# Patient Record
Sex: Female | Born: 1955 | Race: White | Hispanic: No | Marital: Married | Smoking: Former smoker
Health system: Southern US, Community
[De-identification: ages and names within clinical notes are randomized; demographics above are authoritative.]

## PROBLEM LIST (undated history)

## (undated) DIAGNOSIS — G2581 Restless legs syndrome: Secondary | ICD-10-CM

## (undated) HISTORY — PX: NO PAST SURGERIES: SHX2092

---

## 1998-02-24 ENCOUNTER — Other Ambulatory Visit: Admission: RE | Admit: 1998-02-24 | Discharge: 1998-02-24 | Payer: Self-pay | Admitting: Orthopedic Surgery

## 1998-03-09 ENCOUNTER — Other Ambulatory Visit: Admission: RE | Admit: 1998-03-09 | Discharge: 1998-03-09 | Payer: Self-pay | Admitting: Obstetrics and Gynecology

## 1999-01-27 ENCOUNTER — Encounter: Payer: Self-pay | Admitting: Obstetrics & Gynecology

## 1999-01-27 ENCOUNTER — Ambulatory Visit (HOSPITAL_COMMUNITY): Admission: RE | Admit: 1999-01-27 | Discharge: 1999-01-27 | Payer: Self-pay | Admitting: Orthopedic Surgery

## 1999-04-19 ENCOUNTER — Other Ambulatory Visit: Admission: RE | Admit: 1999-04-19 | Discharge: 1999-04-19 | Payer: Self-pay | Admitting: Gynecology

## 1999-08-25 ENCOUNTER — Ambulatory Visit (HOSPITAL_COMMUNITY): Admission: RE | Admit: 1999-08-25 | Discharge: 1999-08-25 | Payer: Self-pay | Admitting: Gynecology

## 1999-08-25 ENCOUNTER — Encounter: Payer: Self-pay | Admitting: Gynecology

## 1999-09-01 ENCOUNTER — Inpatient Hospital Stay: Admission: AD | Admit: 1999-09-01 | Discharge: 1999-09-01 | Payer: Self-pay | Admitting: Gynecology

## 1999-09-02 ENCOUNTER — Inpatient Hospital Stay (HOSPITAL_COMMUNITY): Admission: AD | Admit: 1999-09-02 | Discharge: 1999-09-02 | Payer: Self-pay | Admitting: Gynecology

## 2000-08-06 ENCOUNTER — Other Ambulatory Visit: Admission: RE | Admit: 2000-08-06 | Discharge: 2000-08-06 | Payer: Self-pay | Admitting: Gynecology

## 2000-08-26 ENCOUNTER — Ambulatory Visit (HOSPITAL_COMMUNITY): Admission: RE | Admit: 2000-08-26 | Discharge: 2000-08-26 | Payer: Self-pay | Admitting: Gynecology

## 2000-08-26 ENCOUNTER — Encounter: Payer: Self-pay | Admitting: Gynecology

## 2001-08-19 ENCOUNTER — Other Ambulatory Visit: Admission: RE | Admit: 2001-08-19 | Discharge: 2001-08-19 | Payer: Self-pay | Admitting: Gynecology

## 2001-08-28 ENCOUNTER — Encounter: Payer: Self-pay | Admitting: Gynecology

## 2001-08-28 ENCOUNTER — Ambulatory Visit (HOSPITAL_COMMUNITY): Admission: RE | Admit: 2001-08-28 | Discharge: 2001-08-28 | Payer: Self-pay | Admitting: Gynecology

## 2001-09-02 ENCOUNTER — Encounter: Payer: Self-pay | Admitting: Gynecology

## 2001-09-02 ENCOUNTER — Encounter: Admission: RE | Admit: 2001-09-02 | Discharge: 2001-09-02 | Payer: Self-pay | Admitting: Gynecology

## 2002-10-14 ENCOUNTER — Other Ambulatory Visit: Admission: RE | Admit: 2002-10-14 | Discharge: 2002-10-14 | Payer: Self-pay | Admitting: Gynecology

## 2002-10-21 ENCOUNTER — Encounter: Payer: Self-pay | Admitting: Gynecology

## 2002-10-21 ENCOUNTER — Encounter: Admission: RE | Admit: 2002-10-21 | Discharge: 2002-10-21 | Payer: Self-pay | Admitting: Gynecology

## 2003-11-04 ENCOUNTER — Encounter: Admission: RE | Admit: 2003-11-04 | Discharge: 2003-11-04 | Payer: Self-pay | Admitting: Gynecology

## 2003-11-16 ENCOUNTER — Other Ambulatory Visit: Admission: RE | Admit: 2003-11-16 | Discharge: 2003-11-16 | Payer: Self-pay | Admitting: Gynecology

## 2004-11-21 ENCOUNTER — Other Ambulatory Visit: Admission: RE | Admit: 2004-11-21 | Discharge: 2004-11-21 | Payer: Self-pay | Admitting: Gynecology

## 2004-12-06 ENCOUNTER — Encounter: Admission: RE | Admit: 2004-12-06 | Discharge: 2004-12-06 | Payer: Self-pay | Admitting: Gynecology

## 2005-09-12 ENCOUNTER — Encounter: Admission: RE | Admit: 2005-09-12 | Discharge: 2005-09-12 | Payer: Self-pay | Admitting: Internal Medicine

## 2005-12-24 ENCOUNTER — Encounter: Admission: RE | Admit: 2005-12-24 | Discharge: 2005-12-24 | Payer: Self-pay | Admitting: Gynecology

## 2005-12-25 ENCOUNTER — Other Ambulatory Visit: Admission: RE | Admit: 2005-12-25 | Discharge: 2005-12-25 | Payer: Self-pay | Admitting: Gynecology

## 2006-12-27 ENCOUNTER — Encounter: Admission: RE | Admit: 2006-12-27 | Discharge: 2006-12-27 | Payer: Self-pay | Admitting: Gynecology

## 2007-01-01 ENCOUNTER — Other Ambulatory Visit: Admission: RE | Admit: 2007-01-01 | Discharge: 2007-01-01 | Payer: Self-pay | Admitting: Gynecology

## 2007-12-29 ENCOUNTER — Encounter: Admission: RE | Admit: 2007-12-29 | Discharge: 2007-12-29 | Payer: Self-pay | Admitting: Gynecology

## 2008-03-30 ENCOUNTER — Ambulatory Visit: Payer: Self-pay | Admitting: Internal Medicine

## 2008-12-31 ENCOUNTER — Encounter: Admission: RE | Admit: 2008-12-31 | Discharge: 2008-12-31 | Payer: Self-pay | Admitting: Gynecology

## 2009-01-06 ENCOUNTER — Ambulatory Visit: Payer: Self-pay | Admitting: Internal Medicine

## 2009-02-21 ENCOUNTER — Ambulatory Visit (HOSPITAL_COMMUNITY): Admission: RE | Admit: 2009-02-21 | Discharge: 2009-02-21 | Payer: Self-pay | Admitting: Gastroenterology

## 2009-03-15 ENCOUNTER — Ambulatory Visit: Payer: Self-pay | Admitting: Internal Medicine

## 2009-12-15 ENCOUNTER — Ambulatory Visit: Payer: Self-pay | Admitting: Internal Medicine

## 2010-01-02 ENCOUNTER — Encounter: Admission: RE | Admit: 2010-01-02 | Discharge: 2010-01-02 | Payer: Self-pay | Admitting: Gynecology

## 2010-07-02 ENCOUNTER — Encounter: Payer: Self-pay | Admitting: General Surgery

## 2010-07-31 ENCOUNTER — Ambulatory Visit (INDEPENDENT_AMBULATORY_CARE_PROVIDER_SITE_OTHER): Payer: BC Managed Care – PPO | Admitting: Internal Medicine

## 2010-07-31 DIAGNOSIS — G4700 Insomnia, unspecified: Secondary | ICD-10-CM

## 2010-07-31 DIAGNOSIS — G2581 Restless legs syndrome: Secondary | ICD-10-CM

## 2010-07-31 DIAGNOSIS — R32 Unspecified urinary incontinence: Secondary | ICD-10-CM

## 2011-02-05 ENCOUNTER — Other Ambulatory Visit: Payer: Self-pay | Admitting: Gynecology

## 2011-02-05 DIAGNOSIS — Z1231 Encounter for screening mammogram for malignant neoplasm of breast: Secondary | ICD-10-CM

## 2011-03-19 ENCOUNTER — Ambulatory Visit: Payer: BC Managed Care – PPO

## 2011-03-19 ENCOUNTER — Ambulatory Visit
Admission: RE | Admit: 2011-03-19 | Discharge: 2011-03-19 | Disposition: A | Discharge status: Home / Self Care | Payer: BC Managed Care – PPO | Source: Ambulatory Visit | Attending: Gynecology | Admitting: Gynecology

## 2011-03-19 DIAGNOSIS — Z1231 Encounter for screening mammogram for malignant neoplasm of breast: Secondary | ICD-10-CM

## 2012-04-08 ENCOUNTER — Other Ambulatory Visit: Payer: Self-pay | Admitting: Gynecology

## 2012-04-08 DIAGNOSIS — Z1231 Encounter for screening mammogram for malignant neoplasm of breast: Secondary | ICD-10-CM

## 2012-06-02 ENCOUNTER — Ambulatory Visit: Payer: BC Managed Care – PPO

## 2012-06-02 ENCOUNTER — Ambulatory Visit (INDEPENDENT_AMBULATORY_CARE_PROVIDER_SITE_OTHER): Payer: BC Managed Care – PPO | Admitting: Internal Medicine

## 2012-06-02 ENCOUNTER — Encounter: Payer: Self-pay | Admitting: Internal Medicine

## 2012-06-02 VITALS — BP 118/76 | HR 76 | Ht 62.25 in | Wt 126.5 lb

## 2012-06-02 DIAGNOSIS — G4700 Insomnia, unspecified: Secondary | ICD-10-CM

## 2012-06-02 DIAGNOSIS — F411 Generalized anxiety disorder: Secondary | ICD-10-CM

## 2012-06-02 DIAGNOSIS — G2581 Restless legs syndrome: Secondary | ICD-10-CM | POA: Insufficient documentation

## 2012-06-02 DIAGNOSIS — F419 Anxiety disorder, unspecified: Secondary | ICD-10-CM | POA: Insufficient documentation

## 2012-06-02 DIAGNOSIS — Z23 Encounter for immunization: Secondary | ICD-10-CM

## 2012-06-02 NOTE — Patient Instructions (Addendum)
Continue Klonopin as needed for insomnia and restless leg and anxiety. Return in 6 months

## 2012-06-02 NOTE — Progress Notes (Signed)
  Subjective:    Patient ID: Natasha Hale, female    DOB: 1956/03/28, 56 y.o.   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 History of restless leg syndrome, insomnia and anxiety. History of lymphocytic colitis. History of stress and urge urinary incontinence. She has a handicapped child she adopted from [place]. She's been working in [place] for the past couple of years at [employer]. Now has job with [employer]. Basically staying in [place] during the week and coming home on weekends.    Review of Systems     Objective:   Physical Exam spent 20 minutes speaking with patient about anxiety disorder. Anxiety stems from stress with child. Does well with Klonopin on a limited basis.        Assessment & Plan:  Anxiety  Insomnia  History of restless legs syndrome  Plan: Refill Klonopin for 6 months. Return in 6 months. Needs to have physical exam in the near future. She agrees take influenza vaccine today.

## 2012-12-06 ENCOUNTER — Other Ambulatory Visit: Payer: Self-pay | Admitting: Internal Medicine

## 2012-12-07 NOTE — Telephone Encounter (Signed)
Note December 2013 says pt was to return in 6 months and at some point have PE. Please call her.

## 2012-12-08 ENCOUNTER — Other Ambulatory Visit: Payer: Self-pay

## 2012-12-08 MED ORDER — CLONAZEPAM 0.5 MG PO TABS: 0.5000 mg | ORAL_TABLET | Freq: Every evening | ORAL | Status: DC | PRN

## 2013-02-16 ENCOUNTER — Ambulatory Visit (INDEPENDENT_AMBULATORY_CARE_PROVIDER_SITE_OTHER): Payer: BC Managed Care – PPO | Admitting: Internal Medicine

## 2013-02-16 ENCOUNTER — Other Ambulatory Visit (INDEPENDENT_AMBULATORY_CARE_PROVIDER_SITE_OTHER): Payer: BC Managed Care – PPO | Admitting: Internal Medicine

## 2013-02-16 ENCOUNTER — Other Ambulatory Visit: Payer: Self-pay | Admitting: Internal Medicine

## 2013-02-16 ENCOUNTER — Encounter: Payer: Self-pay | Admitting: Internal Medicine

## 2013-02-16 VITALS — BP 126/68 | HR 92 | Ht 62.0 in | Wt 129.5 lb

## 2013-02-16 DIAGNOSIS — Z1231 Encounter for screening mammogram for malignant neoplasm of breast: Secondary | ICD-10-CM

## 2013-02-16 DIAGNOSIS — Z13228 Encounter for screening for other metabolic disorders: Secondary | ICD-10-CM

## 2013-02-16 DIAGNOSIS — F411 Generalized anxiety disorder: Secondary | ICD-10-CM

## 2013-02-16 DIAGNOSIS — Z1329 Encounter for screening for other suspected endocrine disorder: Secondary | ICD-10-CM

## 2013-02-16 DIAGNOSIS — Z0000 Encounter for general adult medical examination without abnormal findings: Secondary | ICD-10-CM

## 2013-02-16 DIAGNOSIS — Z13220 Encounter for screening for lipoid disorders: Secondary | ICD-10-CM

## 2013-02-16 DIAGNOSIS — Z23 Encounter for immunization: Secondary | ICD-10-CM

## 2013-02-16 DIAGNOSIS — Z780 Asymptomatic menopausal state: Secondary | ICD-10-CM

## 2013-02-16 DIAGNOSIS — Z130 Encounter for screening for diseases of the blood and blood-forming organs and certain disorders involving the immune mechanism: Secondary | ICD-10-CM

## 2013-02-16 LAB — CBC WITH DIFFERENTIAL/PLATELET
Basophils Absolute: 0.1 10*3/uL (ref 0.0–0.1)
Basophils Relative: 1 % (ref 0–1)
Eosinophils Absolute: 0.1 10*3/uL (ref 0.0–0.7)
HCT: 38.5 % (ref 36.0–46.0)
Lymphocytes Relative: 40 % (ref 12–46)
MCH: 31.9 pg (ref 26.0–34.0)
MCHC: 34.5 g/dL (ref 30.0–36.0)
MCV: 92.3 fL (ref 78.0–100.0)
Monocytes Absolute: 0.3 10*3/uL (ref 0.1–1.0)
Monocytes Relative: 6 % (ref 3–12)
Neutro Abs: 2.2 10*3/uL (ref 1.7–7.7)
Neutrophils Relative %: 50 % (ref 43–77)
Platelets: 235 10*3/uL (ref 150–400)
RDW: 13.8 % (ref 11.5–15.5)

## 2013-02-16 LAB — COMPREHENSIVE METABOLIC PANEL
Alkaline Phosphatase: 50 U/L (ref 39–117)
Calcium: 9.4 mg/dL (ref 8.4–10.5)
Chloride: 106 mEq/L (ref 96–112)
Creat: 0.75 mg/dL (ref 0.50–1.10)
Glucose, Bld: 86 mg/dL (ref 70–99)
Total Bilirubin: 0.8 mg/dL (ref 0.3–1.2)
Total Protein: 6.8 g/dL (ref 6.0–8.3)

## 2013-02-16 LAB — LIPID PANEL
HDL: 86 mg/dL (ref >39)
LDL Cholesterol: 101 mg/dL — ABNORMAL HIGH (ref 0–99)
VLDL: 11 mg/dL (ref 0–40)

## 2013-02-16 LAB — POCT URINALYSIS DIPSTICK
Blood, UA: NEGATIVE
Ketones, UA: NEGATIVE
Protein, UA: NEGATIVE

## 2013-02-16 MED ORDER — CLONAZEPAM 0.5 MG PO TABS: 0.5000 mg | ORAL_TABLET | Freq: Every evening | ORAL | Status: DC | PRN

## 2013-02-16 MED ORDER — TETANUS-DIPHTH-ACELL PERTUSSIS 5-2.5-18.5 LF-MCG/0.5 IM SUSP: 0.5000 mL | Freq: Once | INTRAMUSCULAR | Status: DC

## 2013-02-16 NOTE — Progress Notes (Signed)
  Subjective:    Patient ID: Natasha Hale, female    DOB: [birth year], 57 y.o.   MRN: [id number]  HPI  57 year old White female for health maintenance and evaluation of medical problems. GYN physician is Dr. Chevis Pretty. Weight has increased 3 pounds since last visit. [facility] I will order bone density study. Had colonoscopy by Dr. Loreta Ave in 2009.  No known drug allergies  History of anxiety, insomnia, restless leg syndrome.  History of lymphocytic colitis. History of stress and urge urinary incontinence. Had mucocele of lower lip in 2003. Morton's neuroma removed from right foot by Dr. Lajoyce Corners in 1999.  Social history: She is an Pensions consultant, she is married. Has a handicapped child she adopted from [place]. Nonsmoker. Social alcohol consumption. Husband is an attorney , Twana First who practices with [employer] and [employer].  Family history: Father died at age 59 with an MI. Mother died at age 59 with cerebral hemorrhage. One brother died of suicide. 2 brothers and one sister in good health.    Review of Systems  Constitutional: Negative.   HENT: Positive for postnasal drip.   Eyes: Negative.        No recent eye exam- reminded  Respiratory: Negative.   Cardiovascular: Negative.   Gastrointestinal: Negative.   Endocrine: Negative.   Genitourinary:       Stress and urge urinary incontinence. No meds has had urology work up  Allergic/Immunologic: Positive for environmental allergies.  Psychiatric/Behavioral:       Anxiety, insomnia, restless leg       Objective:   Physical Exam  Vitals reviewed. Constitutional: She is oriented to person, place, and time. She appears well-developed and well-nourished. No distress.  HENT:  Head: Normocephalic and atraumatic.  Right Ear: External ear normal.  Left Ear: External ear normal.  Mouth/Throat: Oropharynx is clear and moist. No oropharyngeal exudate.  Eyes: Conjunctivae and EOM are normal. Pupils are equal, round,  and reactive to light. Right eye exhibits no discharge. Left eye exhibits no discharge. No scleral icterus.  Neck: Neck supple. No JVD present. No thyromegaly present.  Cardiovascular: Normal rate, regular rhythm, normal heart sounds and intact distal pulses.   No murmur heard. Pulmonary/Chest: Effort normal and breath sounds normal. No respiratory distress. She has no rales. She exhibits no tenderness.  Breasts normal female  Abdominal: Soft. Bowel sounds are normal. She exhibits no distension and no mass. There is no tenderness. There is no rebound and no guarding.  Genitourinary:  Deferred to Dr. Chevis Pretty  Musculoskeletal: Normal range of motion. She exhibits no edema.  Lymphadenopathy:    She has no cervical adenopathy.  Neurological: She is alert and oriented to person, place, and time. She has normal reflexes. She displays normal reflexes. No cranial nerve deficit. Coordination normal.  Skin: Skin is warm and dry. No rash noted. She is not diaphoretic.  Psychiatric: She has a normal mood and affect. Her behavior is normal. Judgment and thought content normal.          Assessment & Plan:  Anxiety  Insomnia  Stress and urge urinary incontinence  History of lymphocytic colitis-asymptomatic  History of restless leg syndrome  Plan: Continue Klonopin sparingly and return in one year or as needed. Flu and tetanus vaccines given today. Have bone density study in annual mammogram.

## 2013-02-16 NOTE — Patient Instructions (Addendum)
Continue Klonopin. Flu vaccine and tetanus given today. Return in one year. Have mammogram and bone density.

## 2013-02-17 LAB — VITAMIN D 25 HYDROXY (VIT D DEFICIENCY, FRACTURES): Vit D, 25-Hydroxy: 58 ng/mL (ref 30–89)

## 2013-08-21 ENCOUNTER — Ambulatory Visit
Admission: RE | Admit: 2013-08-21 | Discharge: 2013-08-21 | Disposition: A | Discharge status: Home / Self Care | Payer: BC Managed Care – PPO | Source: Ambulatory Visit | Attending: Internal Medicine | Admitting: Internal Medicine

## 2013-08-21 DIAGNOSIS — Z780 Asymptomatic menopausal state: Secondary | ICD-10-CM

## 2013-08-21 DIAGNOSIS — Z1231 Encounter for screening mammogram for malignant neoplasm of breast: Secondary | ICD-10-CM

## 2014-03-01 ENCOUNTER — Encounter: Payer: Self-pay | Admitting: Internal Medicine

## 2014-03-01 ENCOUNTER — Ambulatory Visit (INDEPENDENT_AMBULATORY_CARE_PROVIDER_SITE_OTHER): Payer: BC Managed Care – PPO | Admitting: Internal Medicine

## 2014-03-01 ENCOUNTER — Other Ambulatory Visit (HOSPITAL_COMMUNITY)
Admission: RE | Admit: 2014-03-01 | Discharge: 2014-03-01 | Disposition: A | Discharge status: Home / Self Care | Payer: BC Managed Care – PPO | Source: Ambulatory Visit | Attending: Internal Medicine | Admitting: Internal Medicine

## 2014-03-01 VITALS — BP 122/80 | HR 78 | Ht 62.0 in | Wt 131.0 lb

## 2014-03-01 DIAGNOSIS — Z130 Encounter for screening for diseases of the blood and blood-forming organs and certain disorders involving the immune mechanism: Secondary | ICD-10-CM

## 2014-03-01 DIAGNOSIS — Z0000 Encounter for general adult medical examination without abnormal findings: Secondary | ICD-10-CM

## 2014-03-01 DIAGNOSIS — M8580 Other specified disorders of bone density and structure, unspecified site: Secondary | ICD-10-CM

## 2014-03-01 DIAGNOSIS — N3946 Mixed incontinence: Secondary | ICD-10-CM

## 2014-03-01 DIAGNOSIS — M899 Disorder of bone, unspecified: Secondary | ICD-10-CM

## 2014-03-01 DIAGNOSIS — Z1329 Encounter for screening for other suspected endocrine disorder: Secondary | ICD-10-CM

## 2014-03-01 DIAGNOSIS — M949 Disorder of cartilage, unspecified: Secondary | ICD-10-CM

## 2014-03-01 DIAGNOSIS — Z23 Encounter for immunization: Secondary | ICD-10-CM

## 2014-03-01 DIAGNOSIS — Z01419 Encounter for gynecological examination (general) (routine) without abnormal findings: Secondary | ICD-10-CM | POA: Insufficient documentation

## 2014-03-01 DIAGNOSIS — G2581 Restless legs syndrome: Secondary | ICD-10-CM

## 2014-03-01 DIAGNOSIS — Z13228 Encounter for screening for other metabolic disorders: Secondary | ICD-10-CM

## 2014-03-01 DIAGNOSIS — F411 Generalized anxiety disorder: Secondary | ICD-10-CM

## 2014-03-01 DIAGNOSIS — Z13220 Encounter for screening for lipoid disorders: Secondary | ICD-10-CM

## 2014-03-01 LAB — COMPREHENSIVE METABOLIC PANEL
ALBUMIN: 4.7 g/dL (ref 3.5–5.2)
ALK PHOS: 59 U/L (ref 39–117)
ALT: 18 U/L (ref 0–35)
AST: 22 U/L (ref 0–37)
BILIRUBIN TOTAL: 1.2 mg/dL (ref 0.2–1.2)
BUN: 13 mg/dL (ref 6–23)
CALCIUM: 9.7 mg/dL (ref 8.4–10.5)
CHLORIDE: 102 meq/L (ref 96–112)
CO2: 27 meq/L (ref 19–32)
Creat: 0.8 mg/dL (ref 0.50–1.10)
Glucose, Bld: 94 mg/dL (ref 70–99)
Potassium: 4.2 mEq/L (ref 3.5–5.3)
SODIUM: 138 meq/L (ref 135–145)
Total Protein: 6.8 g/dL (ref 6.0–8.3)

## 2014-03-01 LAB — CBC WITH DIFFERENTIAL/PLATELET
Basophils Absolute: 0 10*3/uL (ref 0.0–0.1)
Basophils Relative: 1 % (ref 0–1)
Eosinophils Absolute: 0.1 10*3/uL (ref 0.0–0.7)
Eosinophils Relative: 2 % (ref 0–5)
HEMATOCRIT: 39.4 % (ref 36.0–46.0)
HEMOGLOBIN: 13.7 g/dL (ref 12.0–15.0)
LYMPHS ABS: 1.7 10*3/uL (ref 0.7–4.0)
Lymphocytes Relative: 37 % (ref 12–46)
MCH: 31.9 pg (ref 26.0–34.0)
MCHC: 34.8 g/dL (ref 30.0–36.0)
MCV: 91.8 fL (ref 78.0–100.0)
MONOS PCT: 8 % (ref 3–12)
Monocytes Absolute: 0.4 10*3/uL (ref 0.1–1.0)
Neutro Abs: 2.4 10*3/uL (ref 1.7–7.7)
Neutrophils Relative %: 52 % (ref 43–77)
PLATELETS: 231 10*3/uL (ref 150–400)
RBC: 4.29 MIL/uL (ref 3.87–5.11)
RDW: 13.5 % (ref 11.5–15.5)
WBC: 4.6 10*3/uL (ref 4.0–10.5)

## 2014-03-01 LAB — POCT URINALYSIS DIPSTICK
BILIRUBIN UA: NEGATIVE
Glucose, UA: NEGATIVE
KETONES UA: NEGATIVE
Leukocytes, UA: NEGATIVE
NITRITE UA: NEGATIVE
PH UA: 6
PROTEIN UA: NEGATIVE
SPEC GRAV UA: 1.015
Urobilinogen, UA: NEGATIVE

## 2014-03-01 LAB — LIPID PANEL
CHOLESTEROL: 200 mg/dL (ref 0–200)
HDL: 93 mg/dL (ref >39)
LDL CALC: 85 mg/dL (ref 0–99)
Total CHOL/HDL Ratio: 2.2 Ratio
Triglycerides: 109 mg/dL (ref <150)
VLDL: 22 mg/dL (ref 0–40)

## 2014-03-01 LAB — TSH: TSH: 2.628 u[IU]/mL (ref 0.350–4.500)

## 2014-03-01 MED ORDER — CLONAZEPAM 0.5 MG PO TABS: ORAL_TABLET | ORAL | Status: DC

## 2014-03-01 NOTE — Progress Notes (Signed)
   Subjective:    Patient ID: Natasha Hale, female    DOB: 02-07-1956, 58 y.o.   MRN: [id number]  HPI  58 year old White Female for health maintenance and evaluation of medical issues. Patient has a keloid in the umbilicus. This is benign. Having some mild shortness of breath. Still has situational stress with her son she adopted from [place] who has cerebral palsy. She has a history of anxiety. History of insomnia and restless leg syndrome. She has a history of lymphocytic colitis. Has a history of stress and urge urinary incontinence. Had Morton's neuroma removed from right foot by Dr. Lajoyce Corners in 1999.  No known drug allergies.  Had colonoscopy by Dr. Lynda Rainwater in 2009.  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 Nonsmoker. Social alcohol consumption. Husband is Twana First.  Family history: Father died at age 72 of an MI. Mother died at age 44 with cerebral hemorrhage. One brother died of suicide. 2 brothers and one sister in good health.       Review of Systems  Respiratory:       Mild shortness of breath  Cardiovascular: Negative for chest pain.  Genitourinary:       Stress and urge urinary incontinence  Neurological:       History of restless leg syndrome  Psychiatric/Behavioral:       Anxiety and insomnia       Objective:   Physical Exam  Vitals reviewed. Constitutional: She is oriented to person, place, and time. She appears well-developed and well-nourished. No distress.  HENT:  Head: Normocephalic and atraumatic.  Right Ear: External ear normal.  Left Ear: External ear normal.  Mouth/Throat: Oropharynx is clear and moist. No oropharyngeal exudate.  Eyes: Conjunctivae and EOM are normal. Pupils are equal, round, and reactive to light. Right eye exhibits no discharge. Left eye exhibits no discharge.  Neck: Neck supple. No JVD present. No thyromegaly present.  Cardiovascular: Normal rate, regular rhythm, normal heart sounds and intact distal pulses.   No  murmur heard. Pulmonary/Chest: Effort normal and breath sounds normal. She has no wheezes.  Abdominal: Bowel sounds are normal. She exhibits no distension and no mass. There is no rebound.  Genitourinary:  Pap taken bimanual normal  Musculoskeletal: Normal range of motion. She exhibits no edema.  Neurological: She is alert and oriented to person, place, and time. She has normal reflexes. No cranial nerve deficit. Coordination normal.  Skin: Skin is warm and dry. No rash noted. She is not diaphoretic. No erythema.  Psychiatric: She has a normal mood and affect. Her behavior is normal. Judgment and thought content normal.          Assessment & Plan:  Anxiety-treated with Klonopin  History of insomnia  History of restless leg syndrome  Complain of mild shortness of breath-chest exam is normal. May be related to anxiety.  Osteopenia-based on bone density study March 2015. Recommend calcium and vitamin D  Plan: Continue same medications and return in one year or as needed.

## 2014-03-01 NOTE — Patient Instructions (Addendum)
Take Klonopin twice daily as needed for anxiety. Return in one year.  Take calcium supplement  500 mg daily. Take 1000 units Vitamin D3 daily. Bone density study next year. Rx given for shingles vaccine

## 2014-03-02 LAB — VITAMIN D 25 HYDROXY (VIT D DEFICIENCY, FRACTURES): VIT D 25 HYDROXY: 32 ng/mL (ref 30–89)

## 2014-03-03 LAB — CYTOLOGY - PAP

## 2014-05-24 ENCOUNTER — Encounter (INDEPENDENT_AMBULATORY_CARE_PROVIDER_SITE_OTHER): Payer: BC Managed Care – PPO | Admitting: Ophthalmology

## 2014-05-24 DIAGNOSIS — H43813 Vitreous degeneration, bilateral: Secondary | ICD-10-CM

## 2014-05-24 DIAGNOSIS — H2513 Age-related nuclear cataract, bilateral: Secondary | ICD-10-CM

## 2014-07-05 ENCOUNTER — Encounter (INDEPENDENT_AMBULATORY_CARE_PROVIDER_SITE_OTHER): Payer: BC Managed Care – PPO | Admitting: Ophthalmology

## 2014-07-12 ENCOUNTER — Ambulatory Visit (INDEPENDENT_AMBULATORY_CARE_PROVIDER_SITE_OTHER): Payer: BC Managed Care – PPO | Admitting: Internal Medicine

## 2014-07-12 ENCOUNTER — Encounter: Payer: Self-pay | Admitting: Internal Medicine

## 2014-07-12 VITALS — BP 122/78 | HR 78 | Temp 97.7°F | Wt 131.0 lb

## 2014-07-12 DIAGNOSIS — R232 Flushing: Secondary | ICD-10-CM

## 2014-07-12 DIAGNOSIS — F419 Anxiety disorder, unspecified: Secondary | ICD-10-CM

## 2014-07-12 DIAGNOSIS — L309 Dermatitis, unspecified: Secondary | ICD-10-CM

## 2014-07-12 DIAGNOSIS — N951 Menopausal and female climacteric states: Secondary | ICD-10-CM

## 2014-07-12 MED ORDER — ESTRADIOL-LEVONORGESTREL 0.045-0.015 MG/DAY TD PTWK: 1.0000 | MEDICATED_PATCH | TRANSDERMAL | Status: DC

## 2014-07-12 NOTE — Progress Notes (Signed)
   Subjective:    Patient ID: Natasha Hale, female    DOB: 09/05/1955, 59 y.o.   MRN: [id number]  HPI  Patient admits to washing her hands very frequently. Says every winter she gets irritation of her hands with redness and sometimes fissures. Says this year has been particularly bad and is worse than ever. Has tried over-the-counter Morscher risers without much relief. She has a history of anxiety disorder. She also is having issues with hot flashes. GYN physician had her on a Climara pro patch but she quit that about a year ago. She is thinking about going back on that. Says she felt better when she was on the patch.    Review of Systems     Objective:   Physical Exam  She has significant erythema and roughness of both hands with multiple superficial fissures hands and fingers      Assessment & Plan:  Hand dermatitis  Hot flashes  Plan: Triamcinolone cream 0.1% 60 g in 4 ounces of uterine cream to use 3 times a day. May need to decrease number of vigorous hand washings daily. If symptoms persist, see dermatologist. Restart Climara Pro patch

## 2014-07-12 NOTE — Patient Instructions (Addendum)
Restart Climara Pro patch to skin weekly. Use triamcinolone and Eucerin cream preparation on hands 3 times daily.

## 2014-07-27 ENCOUNTER — Other Ambulatory Visit: Payer: Self-pay | Admitting: *Deleted

## 2014-07-27 ENCOUNTER — Telehealth: Payer: Self-pay | Admitting: Internal Medicine

## 2014-07-27 NOTE — Telephone Encounter (Signed)
Called in Triamcinolone cream to patient pharmacy

## 2014-07-27 NOTE — Telephone Encounter (Signed)
We prescribed Triamcinolone cream 0.1%  60 grams in 4 ounces of Eucerin Cream. Please CALL this in . Cannot e-scribe this compound.

## 2014-07-27 NOTE — Telephone Encounter (Signed)
Patient was here on 2/1 for visit for dermatitis.  You hand wrote a Rx for her that had written directions on it for her.  She has LOST the Rx.  States she looked high and low for the Rx and she cannot locate the Rx anywhere.  She has put off calling and is embarrassed to have to call and ask for another RX.  From the note, it appears that it could be for Triamcinolone cream?    Please advise and any specific directions that you may recall from that visit.    [facility] [place].

## 2015-03-04 ENCOUNTER — Encounter: Payer: BC Managed Care – PPO | Admitting: Internal Medicine

## 2015-03-18 ENCOUNTER — Other Ambulatory Visit: Payer: BC Managed Care – PPO | Admitting: Internal Medicine

## 2015-03-18 DIAGNOSIS — Z1329 Encounter for screening for other suspected endocrine disorder: Secondary | ICD-10-CM

## 2015-03-18 DIAGNOSIS — Z0000 Encounter for general adult medical examination without abnormal findings: Secondary | ICD-10-CM

## 2015-03-18 DIAGNOSIS — Z1321 Encounter for screening for nutritional disorder: Secondary | ICD-10-CM

## 2015-03-18 DIAGNOSIS — Z130 Encounter for screening for diseases of the blood and blood-forming organs and certain disorders involving the immune mechanism: Secondary | ICD-10-CM

## 2015-03-18 DIAGNOSIS — Z13220 Encounter for screening for lipoid disorders: Secondary | ICD-10-CM

## 2015-03-18 LAB — CBC WITH DIFFERENTIAL/PLATELET
BASOS PCT: 1 % (ref 0–1)
Basophils Absolute: 0.1 10*3/uL (ref 0.0–0.1)
EOS ABS: 0.2 10*3/uL (ref 0.0–0.7)
Eosinophils Relative: 3 % (ref 0–5)
HCT: 41.5 % (ref 36.0–46.0)
Hemoglobin: 14.6 g/dL (ref 12.0–15.0)
Lymphocytes Relative: 33 % (ref 12–46)
Lymphs Abs: 1.7 10*3/uL (ref 0.7–4.0)
MCH: 32.6 pg (ref 26.0–34.0)
MCHC: 35.2 g/dL (ref 30.0–36.0)
MCV: 92.6 fL (ref 78.0–100.0)
MONO ABS: 0.4 10*3/uL (ref 0.1–1.0)
MPV: 9.6 fL (ref 8.6–12.4)
Monocytes Relative: 7 % (ref 3–12)
NEUTROS ABS: 2.9 10*3/uL (ref 1.7–7.7)
Neutrophils Relative %: 56 % (ref 43–77)
PLATELETS: 227 10*3/uL (ref 150–400)
RBC: 4.48 MIL/uL (ref 3.87–5.11)
RDW: 13.2 % (ref 11.5–15.5)
WBC: 5.2 10*3/uL (ref 4.0–10.5)

## 2015-03-18 LAB — LIPID PANEL
CHOL/HDL RATIO: 2.3 ratio (ref <=5.0)
CHOLESTEROL: 193 mg/dL (ref 125–200)
HDL: 83 mg/dL (ref >=46)
LDL Cholesterol: 97 mg/dL (ref <130)
Triglycerides: 63 mg/dL (ref <150)
VLDL: 13 mg/dL (ref <30)

## 2015-03-18 LAB — COMPLETE METABOLIC PANEL WITH GFR
ALBUMIN: 4.8 g/dL (ref 3.6–5.1)
ALT: 19 U/L (ref 6–29)
AST: 19 U/L (ref 10–35)
Alkaline Phosphatase: 55 U/L (ref 33–130)
BUN: 15 mg/dL (ref 7–25)
CHLORIDE: 102 mmol/L (ref 98–110)
CO2: 29 mmol/L (ref 20–31)
Calcium: 9.3 mg/dL (ref 8.6–10.4)
Creat: 0.73 mg/dL (ref 0.50–0.99)
GFR, Est African American: >89 mL/min (ref >=60)
GFR, Est Non African American: 89 mL/min (ref >=60)
GLUCOSE: 89 mg/dL (ref 65–99)
POTASSIUM: 4.8 mmol/L (ref 3.5–5.3)
SODIUM: 140 mmol/L (ref 135–146)
Total Bilirubin: 1.2 mg/dL (ref 0.2–1.2)
Total Protein: 7 g/dL (ref 6.1–8.1)

## 2015-03-18 LAB — TSH: TSH: 1.534 u[IU]/mL (ref 0.350–4.500)

## 2015-03-19 LAB — VITAMIN D 25 HYDROXY (VIT D DEFICIENCY, FRACTURES): VIT D 25 HYDROXY: 45 ng/mL (ref 30–100)

## 2015-03-31 ENCOUNTER — Encounter: Payer: BC Managed Care – PPO | Admitting: Internal Medicine

## 2015-04-15 ENCOUNTER — Ambulatory Visit (INDEPENDENT_AMBULATORY_CARE_PROVIDER_SITE_OTHER): Payer: BC Managed Care – PPO | Admitting: Internal Medicine

## 2015-04-15 ENCOUNTER — Encounter: Payer: Self-pay | Admitting: Internal Medicine

## 2015-04-15 VITALS — BP 130/78 | HR 85 | Temp 98.4°F | Resp 18 | Ht 62.0 in | Wt 130.0 lb

## 2015-04-15 DIAGNOSIS — F411 Generalized anxiety disorder: Secondary | ICD-10-CM

## 2015-04-15 DIAGNOSIS — Z0000 Encounter for general adult medical examination without abnormal findings: Secondary | ICD-10-CM | POA: Diagnosis not present

## 2015-04-15 DIAGNOSIS — G2581 Restless legs syndrome: Secondary | ICD-10-CM | POA: Diagnosis not present

## 2015-04-15 DIAGNOSIS — G4700 Insomnia, unspecified: Secondary | ICD-10-CM

## 2015-04-15 DIAGNOSIS — Z23 Encounter for immunization: Secondary | ICD-10-CM

## 2015-04-15 LAB — POCT URINALYSIS DIPSTICK
Bilirubin, UA: NEGATIVE
Glucose, UA: NEGATIVE
KETONES UA: NEGATIVE
Leukocytes, UA: NEGATIVE
Nitrite, UA: NEGATIVE
PH UA: 6.5
PROTEIN UA: NEGATIVE
SPEC GRAV UA: 1.025
UROBILINOGEN UA: 0.2

## 2015-04-15 MED ORDER — CLONAZEPAM 0.5 MG PO TABS: ORAL_TABLET | ORAL | Status: DC

## 2015-04-15 NOTE — Patient Instructions (Addendum)
Continue Klonopin as needed. It was a pleasure to see you. Return in 6 months.

## 2015-04-15 NOTE — Progress Notes (Signed)
   Subjective:    Patient ID: Natasha Hale, female    DOB: 05/09/1956, 59 y.o.   MRN: [id number]  HPI     59 year old White Female in today for health maintenance exam and evaluation of medical issues. Long-standing history of anxiety and insomnia. History of restless leg syndrome. History of lymphocytic colitis. History of stress and urge urinary incontinence. Had Morton's neuroma removed from right foot by Dr. Lajoyce Cornersuda 1999.  No known drug allergies.  Colonoscopy by Dr. Loreta AveMann in 2009.  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 Social alcohol consumption. Husband is Twana FirstKenneth Johnson. He is also an Pensions consultantattorney. For [place] they were living in the [place] area but [place]. She has an  adopted son from [place] who has cerebral palsy. She had some issues finding suitable care and treatment for [place] in the [place] area. Is pleased where she is living now and with arrangements for her son.  Family history: Father died at age 10859 of an MI. Mother died at age 61 with cerebral hemorrhage. One brother died of suicide. 2 brothers and one sister in good health.   Review of Systems noncontributory      Objective:   Physical Exam  Constitutional: She is oriented to person, place, and time. She appears well-developed and well-nourished. No distress.  HENT:  Head: Normocephalic and atraumatic.  Right Ear: External ear normal.  Left Ear: External ear normal.  Mouth/Throat: Oropharynx is clear and moist. No oropharyngeal exudate.  Eyes: Conjunctivae are normal. Pupils are equal, round, and reactive to light. Right eye exhibits no discharge. Left eye exhibits no discharge.  Neck: Neck supple. No JVD present. No thyromegaly present.  Cardiovascular: Normal rate, regular rhythm, normal heart sounds and intact distal pulses.   No murmur heard. Pulmonary/Chest: Effort normal and breath sounds normal. She has no wheezes. She has no rales.  Breasts normal female  without masses  Abdominal: Soft. Bowel sounds are normal. She exhibits no distension and no mass. There is no tenderness. There is no rebound and no guarding.  Genitourinary:  Deferred  Musculoskeletal: She exhibits no edema.  Neurological: She is alert and oriented to person, place, and time. She has normal reflexes.  Skin: Skin is warm and dry. No rash noted. She is not diaphoretic.  Psychiatric: She has a normal mood and affect. Her behavior is normal. Judgment and thought content normal.  Vitals reviewed.         Assessment & Plan:   Anxiety  Insomnia  Restless leg syndrome  Lymphocytic colitis  Osteopenia based on bone density study March 2015  Plan: Continue Klonopin 1 by mouth twice daily as needed for anxiety #60 with 5 refills. Return in 6 months or as needed.

## 2015-05-27 ENCOUNTER — Ambulatory Visit (INDEPENDENT_AMBULATORY_CARE_PROVIDER_SITE_OTHER): Payer: BC Managed Care – PPO | Admitting: Internal Medicine

## 2015-05-27 ENCOUNTER — Encounter: Payer: Self-pay | Admitting: Internal Medicine

## 2015-05-27 VITALS — BP 108/82 | HR 84 | Temp 98.3°F | Resp 18 | Ht 62.0 in | Wt 133.0 lb

## 2015-05-27 DIAGNOSIS — H6692 Otitis media, unspecified, left ear: Secondary | ICD-10-CM

## 2015-05-27 DIAGNOSIS — J0100 Acute maxillary sinusitis, unspecified: Secondary | ICD-10-CM

## 2015-05-27 MED ORDER — AMOXICILLIN-POT CLAVULANATE 875-125 MG PO TABS: 1.0000 | ORAL_TABLET | Freq: Two times a day (BID) | ORAL | Status: DC

## 2015-05-27 NOTE — Patient Instructions (Addendum)
Augmentin 875 mg twice a day x 10 days. Call if not better in 7-10 days or sooner if worse. It was a pleasure to see you today.

## 2015-06-11 NOTE — Progress Notes (Signed)
   Subjective:    Patient ID: Natasha Hale, female    DOB: 04/29/1956, 59 y.o.   MRN: [id number]  HPI In today with URI symptoms, maxillary sinus tenderness and congestion and left ear pain. No fever or shaking chills. Has malaise and fatigue.    Review of Systems     Objective:   Physical Exam Left TM is red. Right TM full but not red. Pharynx slightly injected. Neck supple. Chest clear to auscultation. Skin warm and dry.       Assessment & Plan:  Acute left otitis media  Maxillary sinusitis  Plan: Augmentin 875 mg twice daily for 10 days.

## 2015-07-12 ENCOUNTER — Other Ambulatory Visit: Payer: Self-pay | Admitting: Internal Medicine

## 2015-07-12 DIAGNOSIS — Z1231 Encounter for screening mammogram for malignant neoplasm of breast: Secondary | ICD-10-CM

## 2015-07-28 ENCOUNTER — Ambulatory Visit
Admission: RE | Admit: 2015-07-28 | Discharge: 2015-07-28 | Disposition: A | Discharge status: Home / Self Care | Payer: BC Managed Care – PPO | Source: Ambulatory Visit | Attending: Internal Medicine | Admitting: Internal Medicine

## 2015-07-28 DIAGNOSIS — Z1231 Encounter for screening mammogram for malignant neoplasm of breast: Secondary | ICD-10-CM

## 2016-02-08 ENCOUNTER — Telehealth: Payer: Self-pay | Admitting: Internal Medicine

## 2016-02-08 NOTE — Telephone Encounter (Signed)
Refill Klonopin for 90 days only

## 2016-02-08 NOTE — Telephone Encounter (Signed)
Patient requesting a refill on Klonopin 0.5mg .  Advised patient that she has to be seen every 6 months for this type of medication.  And, she's due for her physical.  Advised that she needed to book her CPE for this year.  We booked that for 04/16/16.    [facility]

## 2016-02-09 MED ORDER — CLONAZEPAM 0.5 MG PO TABS: ORAL_TABLET | ORAL | 2 refills | Status: DC

## 2016-02-09 NOTE — Telephone Encounter (Signed)
Done

## 2016-04-12 ENCOUNTER — Other Ambulatory Visit: Payer: BC Managed Care – PPO | Admitting: Internal Medicine

## 2016-04-12 DIAGNOSIS — Z0000 Encounter for general adult medical examination without abnormal findings: Secondary | ICD-10-CM

## 2016-04-12 LAB — COMPREHENSIVE METABOLIC PANEL
ALBUMIN: 4.7 g/dL (ref 3.6–5.1)
ALT: 16 U/L (ref 6–29)
AST: 18 U/L (ref 10–35)
Alkaline Phosphatase: 55 U/L (ref 33–130)
BUN: 13 mg/dL (ref 7–25)
CHLORIDE: 102 mmol/L (ref 98–110)
CO2: 27 mmol/L (ref 20–31)
Calcium: 9.5 mg/dL (ref 8.6–10.4)
Creat: 0.73 mg/dL (ref 0.50–0.99)
Glucose, Bld: 91 mg/dL (ref 65–99)
POTASSIUM: 4.6 mmol/L (ref 3.5–5.3)
Sodium: 139 mmol/L (ref 135–146)
TOTAL PROTEIN: 7.2 g/dL (ref 6.1–8.1)
Total Bilirubin: 0.9 mg/dL (ref 0.2–1.2)

## 2016-04-12 LAB — CBC WITH DIFFERENTIAL/PLATELET
BASOS ABS: 47 {cells}/uL (ref 0–200)
Basophils Relative: 1 %
EOS ABS: 94 {cells}/uL (ref 15–500)
Eosinophils Relative: 2 %
HCT: 42.9 % (ref 35.0–45.0)
Hemoglobin: 14.3 g/dL (ref 11.7–15.5)
Lymphocytes Relative: 38 %
Lymphs Abs: 1786 cells/uL (ref 850–3900)
MCH: 31.7 pg (ref 27.0–33.0)
MCHC: 33.3 g/dL (ref 32.0–36.0)
MCV: 95.1 fL (ref 80.0–100.0)
MONOS PCT: 8 %
MPV: 9.8 fL (ref 7.5–12.5)
Monocytes Absolute: 376 cells/uL (ref 200–950)
NEUTROS ABS: 2397 {cells}/uL (ref 1500–7800)
NEUTROS PCT: 51 %
PLATELETS: 230 10*3/uL (ref 140–400)
RBC: 4.51 MIL/uL (ref 3.80–5.10)
RDW: 13.7 % (ref 11.0–15.0)
WBC: 4.7 10*3/uL (ref 3.8–10.8)

## 2016-04-12 LAB — LIPID PANEL
Cholesterol: 213 mg/dL — ABNORMAL HIGH (ref 125–200)
HDL: 89 mg/dL (ref >=46)
LDL Cholesterol: 111 mg/dL (ref <130)
TRIGLYCERIDES: 66 mg/dL (ref <150)
Total CHOL/HDL Ratio: 2.4 Ratio (ref <=5.0)
VLDL: 13 mg/dL (ref <30)

## 2016-04-12 LAB — LDL CHOLESTEROL, DIRECT: LDL DIRECT: 111 mg/dL (ref <130)

## 2016-04-12 LAB — TSH: TSH: 1.81 mIU/L

## 2016-04-13 LAB — VITAMIN D 25 HYDROXY (VIT D DEFICIENCY, FRACTURES): VIT D 25 HYDROXY: 46 ng/mL (ref 30–100)

## 2016-04-16 ENCOUNTER — Ambulatory Visit (INDEPENDENT_AMBULATORY_CARE_PROVIDER_SITE_OTHER): Payer: BC Managed Care – PPO | Admitting: Internal Medicine

## 2016-04-16 ENCOUNTER — Encounter: Payer: Self-pay | Admitting: Internal Medicine

## 2016-04-16 VITALS — BP 114/78 | HR 84 | Temp 97.3°F | Ht 61.5 in | Wt 131.0 lb

## 2016-04-16 DIAGNOSIS — G2581 Restless legs syndrome: Secondary | ICD-10-CM | POA: Diagnosis not present

## 2016-04-16 DIAGNOSIS — F411 Generalized anxiety disorder: Secondary | ICD-10-CM

## 2016-04-16 DIAGNOSIS — M545 Low back pain, unspecified: Secondary | ICD-10-CM

## 2016-04-16 DIAGNOSIS — Z23 Encounter for immunization: Secondary | ICD-10-CM

## 2016-04-16 DIAGNOSIS — F5101 Primary insomnia: Secondary | ICD-10-CM

## 2016-04-16 DIAGNOSIS — G8929 Other chronic pain: Secondary | ICD-10-CM | POA: Diagnosis not present

## 2016-04-16 DIAGNOSIS — Z0000 Encounter for general adult medical examination without abnormal findings: Secondary | ICD-10-CM

## 2016-04-16 MED ORDER — CLONAZEPAM 0.5 MG PO TABS: ORAL_TABLET | ORAL | 2 refills | Status: DC

## 2016-04-16 NOTE — Progress Notes (Signed)
Subjective:    Patient ID: Natasha Hale, female    DOB: 10/22/1955, 60 y.o.   MRN: [id number]  HPI   60 year old Female for health maintenance exam and evaluation of medical issues. Issues with left SI joint pain since January 2017. She saw Dr. Milinda CaveMcGowen at [facility] in [place].  Plain x-rays showed L4-L5 spondylosis with lateral translation causing L4 nerve root pain. Was diagnosed with scoliosis of lumbar spine and left sciatica. Mobile and aquatic therapy recommended. If no improvement MRI was recommended. Subsequently she tried meloxicam but did not take that on a regular basis. Subsequently saw a chiropractor. Back pain started after moving to a different house and unpacking boxes, moving furniture etc.  Patient has long-standing history of anxiety and insomnia. History of restless leg syndrome. History of lymphocytic colitis. History of stress and urge urinary incontinence. Had Morton's neuroma removed from right foot by Dr. Lajoyce Cornersuda in 1999.  Had colonoscopy by Dr. Loreta AveMann in 2009.  No known drug allergies.  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 Social consumption. Husband is Natasha Hale who is also an Pensions consultantattorney. For a [place] in the [place] area but [place]. She has an adopted son from [place] who has cerebral palsy. She  had some issues finding suitable care and treatment for [place] in the [place]. She is pleased where she is living now and with the arrangements for her son.  Family history: Father died at age 159 of an MI. Mother died at age 62 with a cerebral hemorrhage. One brother died of suicide. 2 brothers and one sister in good health.    Review of Systems low back pain     Objective:   Physical Exam  Constitutional: She is oriented to person, place, and time. She appears well-developed and well-nourished. No distress.  HENT:  Head: Normocephalic and atraumatic.  Right Ear: External ear  normal.  Left Ear: External ear normal.  Mouth/Throat: Oropharynx is clear and moist. No oropharyngeal exudate.  Eyes: Conjunctivae and EOM are normal. Pupils are equal, round, and reactive to light. Right eye exhibits no discharge. Left eye exhibits no discharge. No scleral icterus.  Neck: Neck supple. No JVD present. No thyromegaly present.  Cardiovascular: Normal rate, regular rhythm, normal heart sounds and intact distal pulses.   No murmur heard. Pulmonary/Chest: Effort normal and breath sounds normal. No respiratory distress. She has no wheezes. She has no rales. She exhibits no tenderness.  Breasts normal female without masses  Abdominal: She exhibits no distension and no mass. There is no tenderness. There is no rebound and no guarding.  Genitourinary:  Genitourinary Comments: Deferred  Musculoskeletal: She exhibits no edema.  Straight leg raising is negative at 90  Lymphadenopathy:    She has no cervical adenopathy.  Neurological: She is alert and oriented to person, place, and time. She has normal reflexes. No cranial nerve deficit. Coordination normal.  Skin: Skin is warm and dry. No rash noted. She is not diaphoretic.  Psychiatric: She has a normal mood and affect. Her behavior is normal. Judgment and thought content normal.  Vitals reviewed.         Assessment & Plan:  Low back pain-left SI joint. Trial of prednisone Sterapred DS 10 mg 12 day dosepak and follow-up in 3 weeks. After finishing prednisone try meloxicam 15 mg daily.  Anxiety-treated with Klonopin  Insomnia  Restless leg syndrome  History of  lymphocytic colitis  History of osteopenia based on bone density study March 2015  Continue Klonopin as needed when necessary anxiety up to twice a day.

## 2016-04-16 NOTE — Patient Instructions (Addendum)
Sterapred DS 10 mg 12 day dose pack  Then Meloxicam 15 mg daily and  RTC in 3 weeks.

## 2016-04-16 NOTE — Progress Notes (Signed)
   Subjective:    Patient ID: Natasha Hale, female    DOB: 01/31/1956, 60 y.o.   MRN: [id number]  HPI    Review of Systems     Objective:   Physical Exam        Assessment & Plan:

## 2016-05-08 ENCOUNTER — Ambulatory Visit (INDEPENDENT_AMBULATORY_CARE_PROVIDER_SITE_OTHER): Payer: BC Managed Care – PPO | Admitting: Internal Medicine

## 2016-05-08 ENCOUNTER — Encounter: Payer: Self-pay | Admitting: Internal Medicine

## 2016-05-08 VITALS — BP 116/78 | HR 79 | Temp 98.4°F | Ht 62.0 in | Wt 133.0 lb

## 2016-05-08 DIAGNOSIS — M545 Low back pain: Secondary | ICD-10-CM | POA: Diagnosis not present

## 2016-05-08 MED ORDER — MELOXICAM 15 MG PO TABS
15.0000 mg | ORAL_TABLET | Freq: Every day | ORAL | 2 refills | Status: DC
Start: 2016-05-08 — End: 2016-08-08

## 2016-05-10 NOTE — Progress Notes (Signed)
   Subjective:    Patient ID: Natasha Hale, female    DOB: 02/25/1956, 60 y.o.   MRN: [id number]  HPI Att last visit, she was having considerable pain left back into left buttock and left leg. We found her records from [facility] where she saw Dr. Milinda CaveMcGowen one in April 2017 and was treated with mobile. This did not help but not sure she took it regularly. She was diagnosed with left L4-L5 spondylosis with lateral translation causing L4 nerve root pain. X-rays of lower back showed lumbar scoliosis with spondylosis and a lateral shift L4-L5. It was recommended she have MRI if she did not improve. Patient is tired of hurting. At last visit we gave her Sterapred DS 10 mg 12 day dosepak and then advised her to restart meloxicam 15 mg daily. This is three-week follow-up appointment today.  Complaining of some intermittent paresthesias left lower extremity   Review of Systems see above     Objective:   Physical Exam Straight leg raising is negative on the right as well as the left.       Assessment & Plan:  Sciatica left lower extremity possible L4-L5 impingement  Did not respond to prednisone for 12 days  Plan: MRI of the LS spine

## 2016-05-10 NOTE — Patient Instructions (Signed)
To have MRI of the LS-spine.

## 2016-05-17 ENCOUNTER — Other Ambulatory Visit: Payer: BC Managed Care – PPO

## 2016-05-17 ENCOUNTER — Ambulatory Visit
Admission: RE | Admit: 2016-05-17 | Discharge: 2016-05-17 | Disposition: A | Discharge status: Home / Self Care | Payer: BC Managed Care – PPO | Source: Ambulatory Visit | Attending: Internal Medicine | Admitting: Internal Medicine

## 2016-05-17 DIAGNOSIS — M545 Low back pain: Secondary | ICD-10-CM

## 2016-05-18 ENCOUNTER — Telehealth: Payer: Self-pay | Admitting: Internal Medicine

## 2016-05-18 NOTE — Telephone Encounter (Signed)
Per Jenel Lucksoberta at [facility], patient would be scheduled for an injection not consult.  If this is appropriate please place order and fax to [place] at [phone].

## 2016-05-18 NOTE — Telephone Encounter (Signed)
Discussed management of back pain with patient and MRI results. Recommend physical therapy 2-3 times weekly for 3 weeks. If pain persists, consider epidural steroid injection at L1-L2

## 2016-05-18 NOTE — Telephone Encounter (Signed)
Per Dr Lenord FellersBaxley patient may benefit from back injection with Dr Benard [facility]urnes [facility].  Left message for Jenel LucksRoberta to schedule consult.

## 2016-08-08 ENCOUNTER — Other Ambulatory Visit: Payer: Self-pay | Admitting: Internal Medicine

## 2016-10-12 IMAGING — MG MM SCREEN MAMMOGRAM BILATERAL
4 series · 4 of 4 positions shown · non-contrast
Comparison: Previous exam(s).

CLINICAL DATA: Screening.

EXAM:
DIGITAL SCREENING BILATERAL MAMMOGRAM WITH CAD

[R CC]
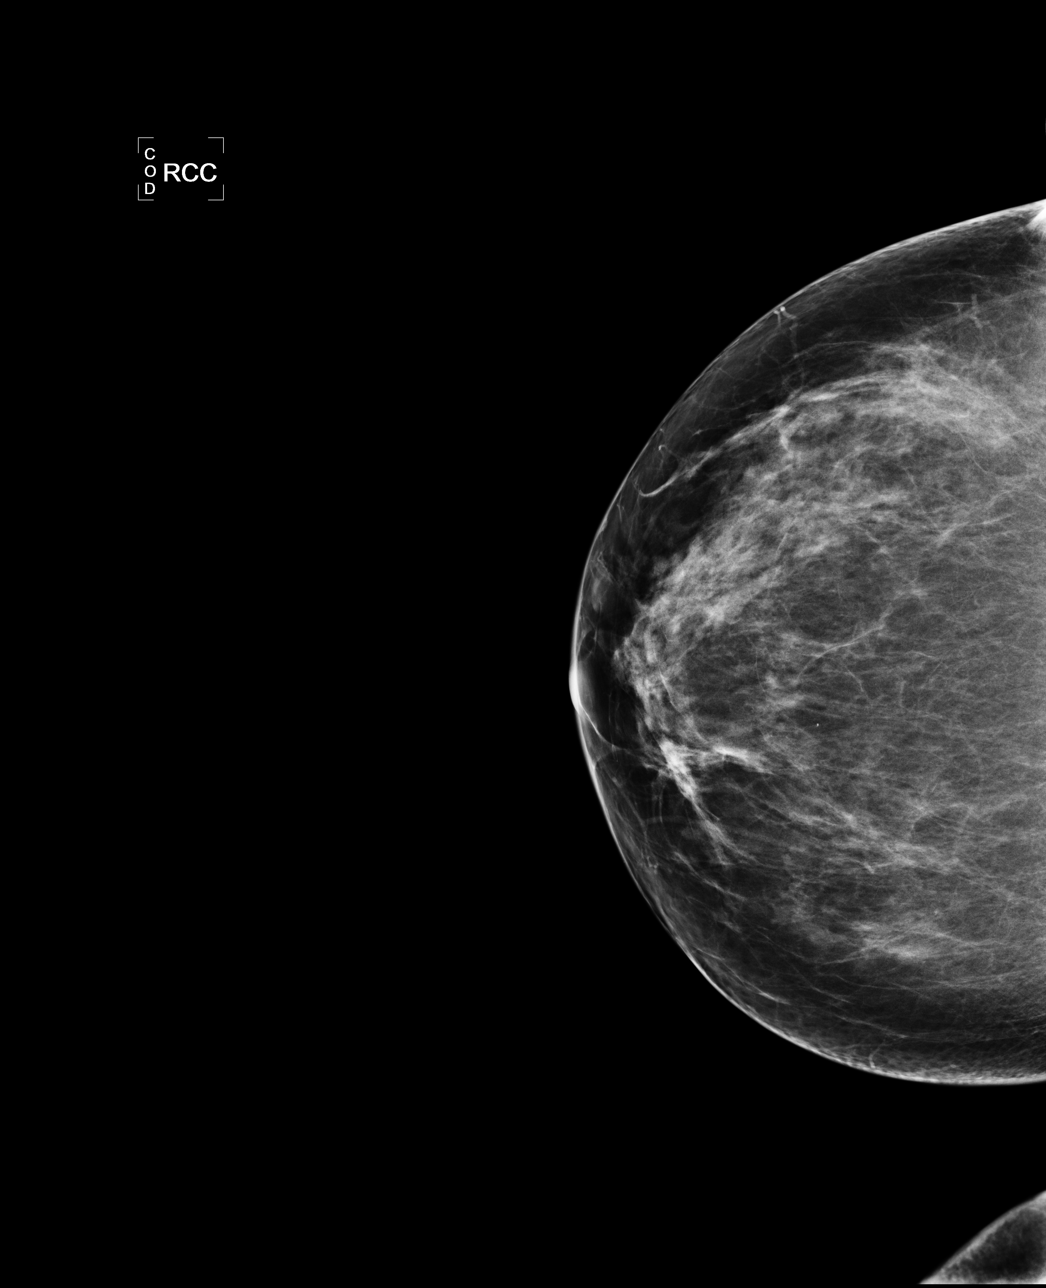

[L CC]
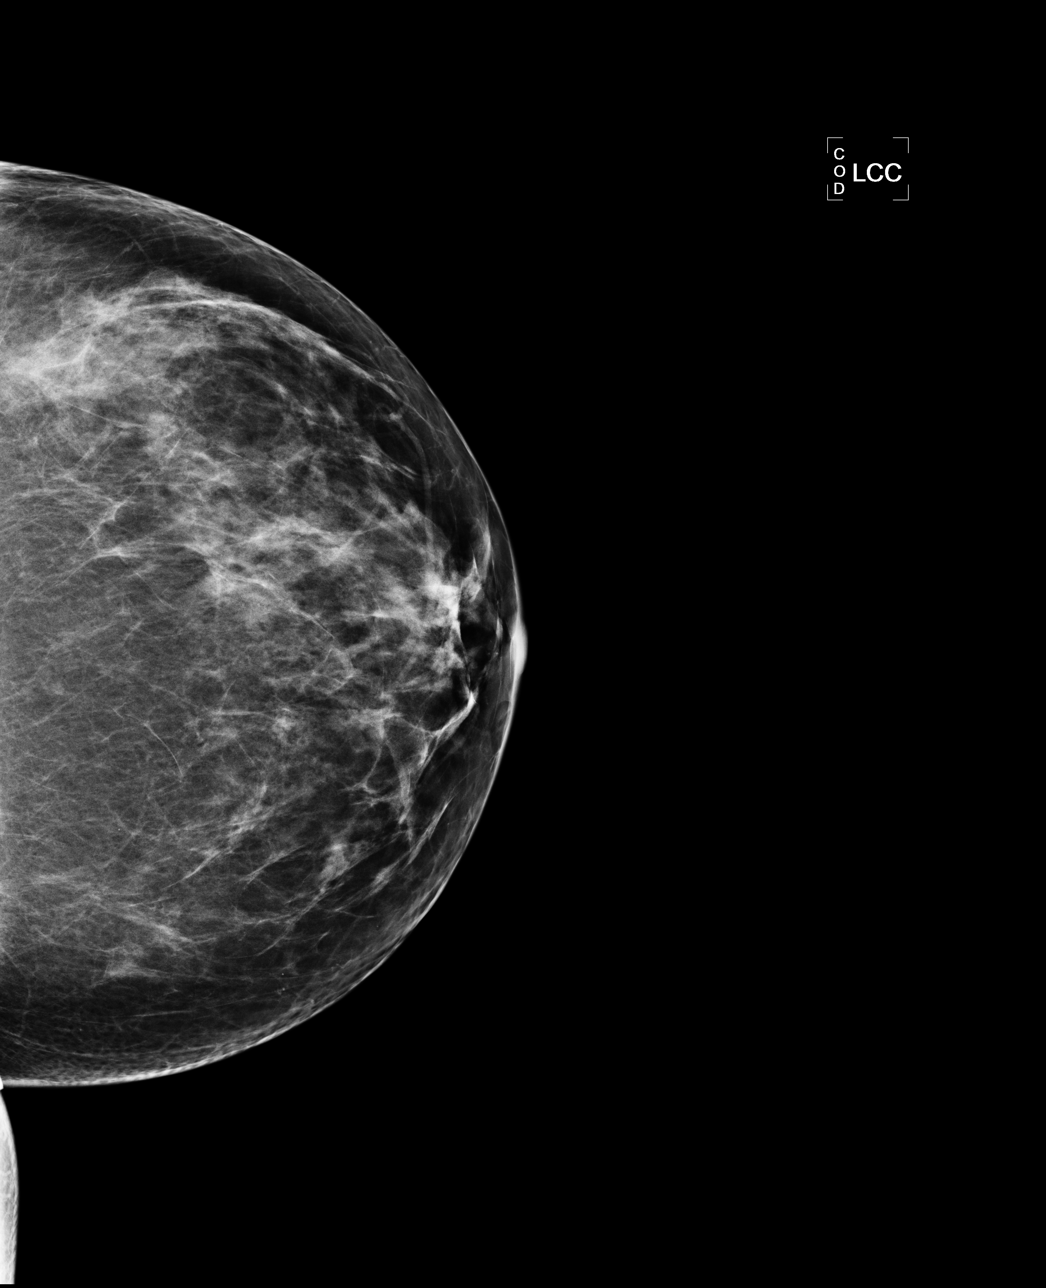

[L MLO]
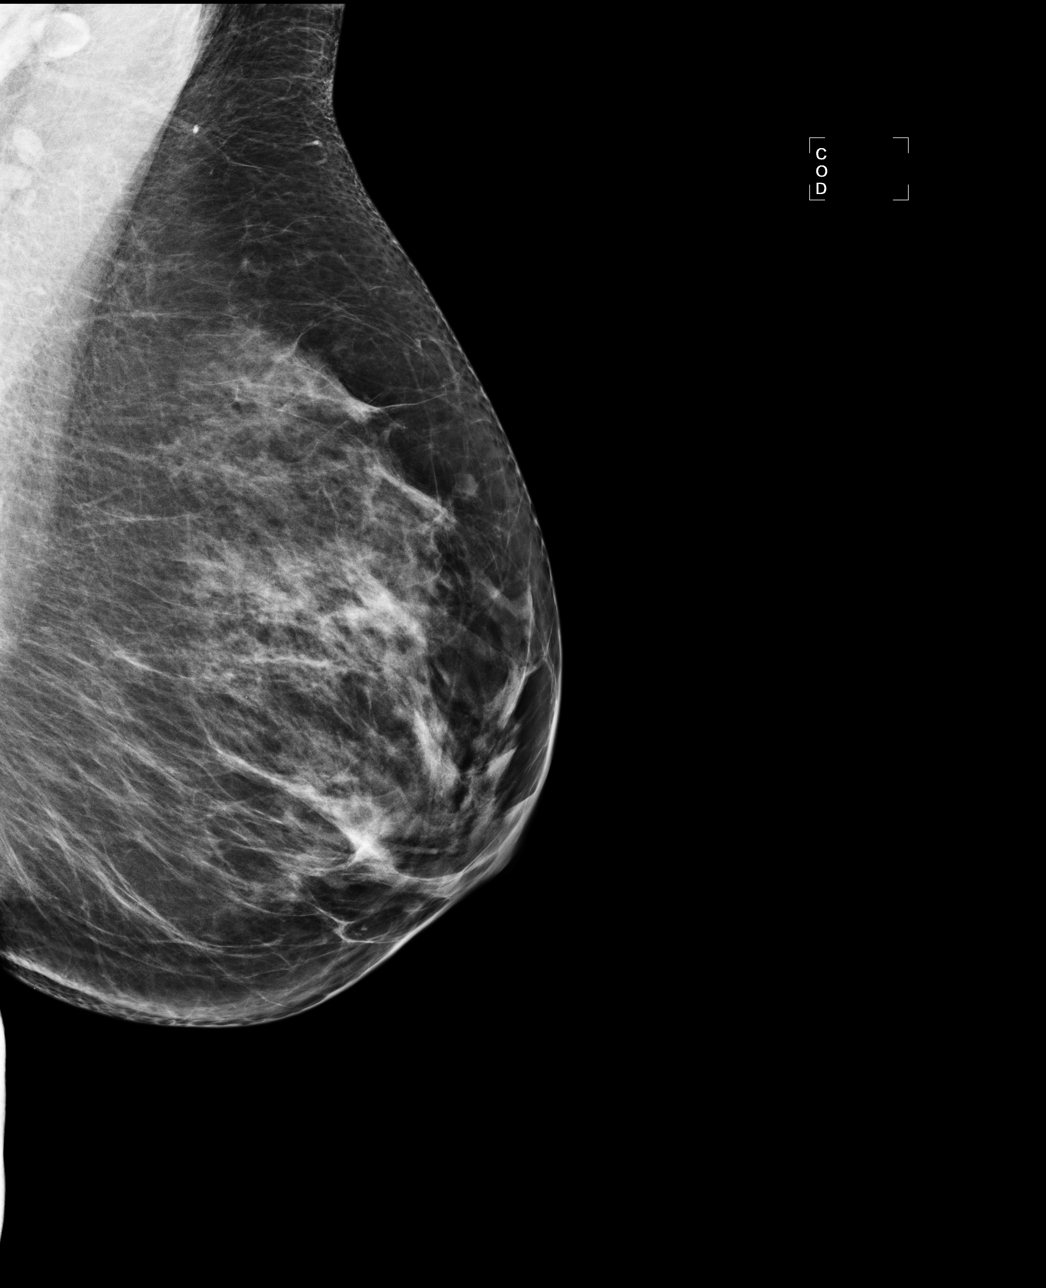

[R MLO]
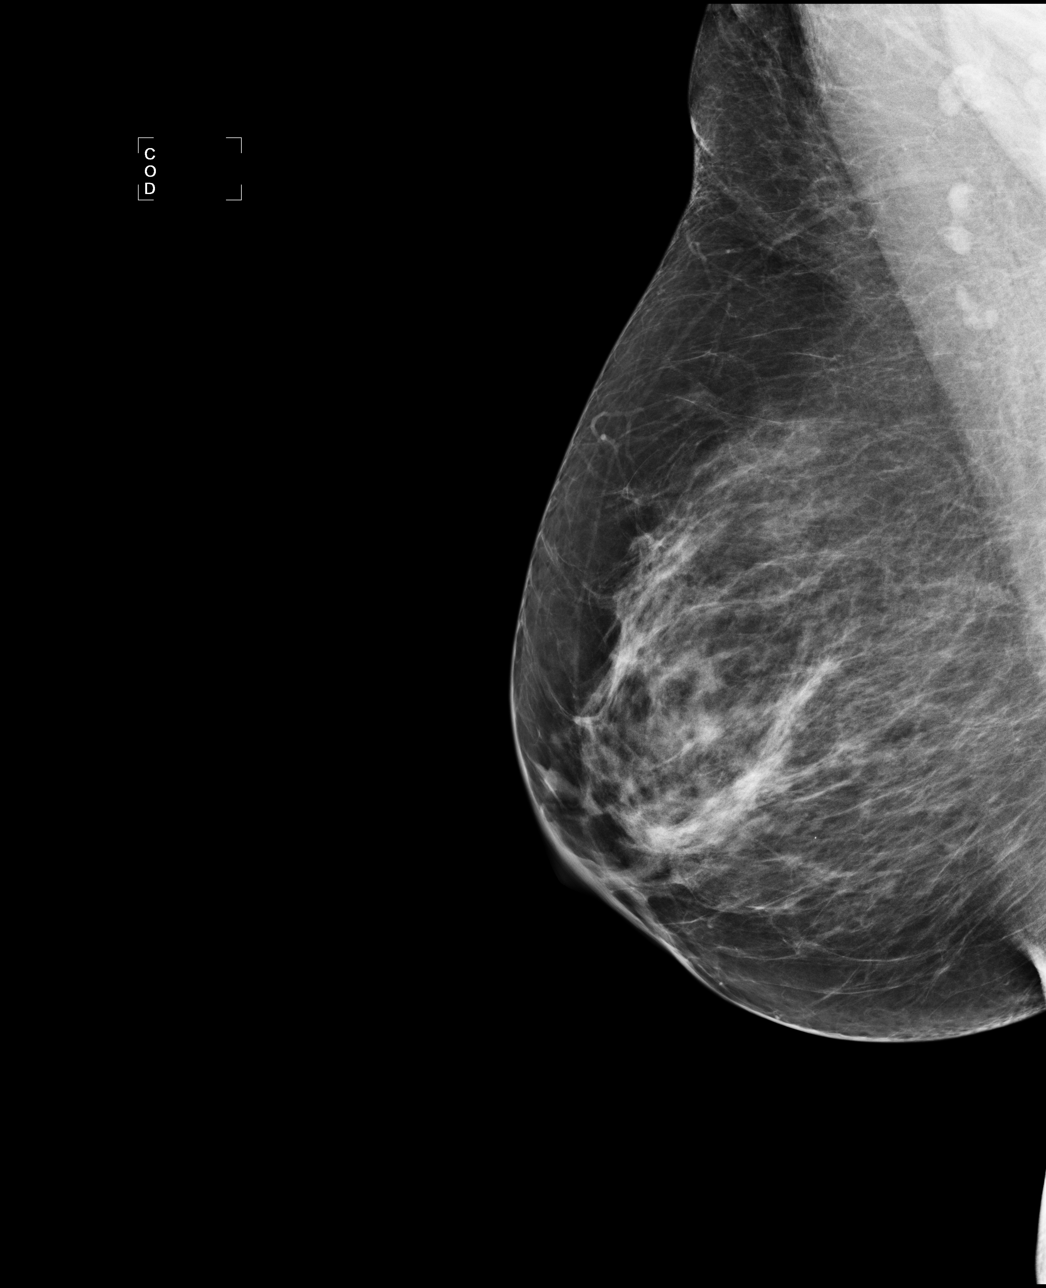

[4 of 4 positions shown; findings below may reference images not displayed]

ACR Breast Density Category b: There are scattered areas of
fibroglandular density.
FINDINGS: There are no findings suspicious for malignancy. Images were
processed with CAD.
IMPRESSION: No mammographic evidence of malignancy. A result letter of this
screening mammogram will be mailed directly to the patient.

RECOMMENDATION:
Screening mammogram in one year. (Code:AS-G-LCT)

BI-RADS CATEGORY  1: Negative.

## 2017-01-14 ENCOUNTER — Other Ambulatory Visit: Payer: Self-pay | Admitting: Internal Medicine

## 2017-01-14 DIAGNOSIS — Z1231 Encounter for screening mammogram for malignant neoplasm of breast: Secondary | ICD-10-CM

## 2017-01-17 ENCOUNTER — Ambulatory Visit: Payer: BC Managed Care – PPO

## 2017-01-21 ENCOUNTER — Ambulatory Visit
Admission: RE | Admit: 2017-01-21 | Discharge: 2017-01-21 | Disposition: A | Discharge status: Home / Self Care | Payer: BC Managed Care – PPO | Source: Ambulatory Visit | Attending: Internal Medicine | Admitting: Internal Medicine

## 2017-01-21 DIAGNOSIS — Z1231 Encounter for screening mammogram for malignant neoplasm of breast: Secondary | ICD-10-CM

## 2017-01-23 ENCOUNTER — Other Ambulatory Visit: Payer: Self-pay | Admitting: Internal Medicine

## 2017-01-23 NOTE — Telephone Encounter (Signed)
Please call her for CPE appt November  before refilling

## 2017-01-24 ENCOUNTER — Ambulatory Visit: Payer: BC Managed Care – PPO

## 2017-04-22 ENCOUNTER — Other Ambulatory Visit: Payer: BC Managed Care – PPO | Admitting: Internal Medicine

## 2017-04-25 ENCOUNTER — Encounter: Payer: BC Managed Care – PPO | Admitting: Internal Medicine

## 2017-04-26 ENCOUNTER — Other Ambulatory Visit (HOSPITAL_COMMUNITY)
Admission: RE | Admit: 2017-04-26 | Discharge: 2017-04-26 | Disposition: A | Discharge status: Home / Self Care | Payer: BC Managed Care – PPO | Source: Ambulatory Visit | Attending: Internal Medicine | Admitting: Internal Medicine

## 2017-04-26 ENCOUNTER — Encounter: Payer: Self-pay | Admitting: Internal Medicine

## 2017-04-26 ENCOUNTER — Other Ambulatory Visit: Payer: BC Managed Care – PPO | Admitting: Internal Medicine

## 2017-04-26 ENCOUNTER — Ambulatory Visit (INDEPENDENT_AMBULATORY_CARE_PROVIDER_SITE_OTHER): Payer: BC Managed Care – PPO | Admitting: Internal Medicine

## 2017-04-26 VITALS — BP 114/64 | HR 76 | Temp 98.2°F | Ht 62.0 in | Wt 134.0 lb

## 2017-04-26 DIAGNOSIS — Z13220 Encounter for screening for lipoid disorders: Secondary | ICD-10-CM

## 2017-04-26 DIAGNOSIS — F5101 Primary insomnia: Secondary | ICD-10-CM

## 2017-04-26 DIAGNOSIS — Z0000 Encounter for general adult medical examination without abnormal findings: Secondary | ICD-10-CM | POA: Insufficient documentation

## 2017-04-26 DIAGNOSIS — F419 Anxiety disorder, unspecified: Secondary | ICD-10-CM

## 2017-04-26 DIAGNOSIS — Z1329 Encounter for screening for other suspected endocrine disorder: Secondary | ICD-10-CM

## 2017-04-26 DIAGNOSIS — Z130 Encounter for screening for diseases of the blood and blood-forming organs and certain disorders involving the immune mechanism: Secondary | ICD-10-CM

## 2017-04-26 DIAGNOSIS — M545 Low back pain: Secondary | ICD-10-CM | POA: Diagnosis not present

## 2017-04-26 DIAGNOSIS — G2581 Restless legs syndrome: Secondary | ICD-10-CM

## 2017-04-26 DIAGNOSIS — Z1321 Encounter for screening for nutritional disorder: Secondary | ICD-10-CM

## 2017-04-26 DIAGNOSIS — G4700 Insomnia, unspecified: Secondary | ICD-10-CM

## 2017-04-26 LAB — POCT URINALYSIS DIPSTICK
Bilirubin, UA: NEGATIVE
Blood, UA: NEGATIVE
GLUCOSE UA: NEGATIVE
Ketones, UA: NEGATIVE
Leukocytes, UA: NEGATIVE
Nitrite, UA: NEGATIVE
Protein, UA: NEGATIVE
SPEC GRAV UA: 1.015 (ref 1.010–1.025)
Urobilinogen, UA: 0.2 E.U./dL
pH, UA: 7.5 (ref 5.0–8.0)

## 2017-04-26 MED ORDER — CLONAZEPAM 0.5 MG PO TABS: ORAL_TABLET | ORAL | 2 refills | Status: DC

## 2017-04-26 NOTE — Progress Notes (Signed)
   Subjective:    Patient ID: Natasha Hale, female    DOB: 12/20/1955, 61 y.o.   MRN: [id number]  HPI [age] year Female for health maintenance exam and evaluation of medical issues.    She has a history of left SI joint pain since January 2017.  She saw Dr. Marvel PlanMcGowan at [facility] in [place].  Was diagnosed with L4-L5 spondylosis with lateral translation causing L4 nerve root pain.  Was diagnosed with scoliosis of the lumbar spine and left sciatica.  Mobic and aqua therapy recommended.  Subsequently saw a chiropractor.  Back pain started after moving to a different house unpacking boxes and moving furniture.  She  still has issues with back pain but does not want further evaluation right now.  Long-standing history of anxiety and insomnia.  History of restless leg syndrome.  History of lymphocytic colitis.  History of stress and urge urinary incontinence.  Had Morton's neuroma removed from right foot by Dr. Lajoyce Cornersuda 1999.  Colonoscopy by Dr. 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  Non-smoker.  Husband is Twana FirstKenneth Johnson who is also an Pensions consultantattorney.  She has an adopted son from [place] who has cerebral palsy.  Family history: Father died at age 63 of an MI.  Mother died at age 63 with cerebral hemorrhage.  One brother died of suicide.  2 brothers and 1 sister in good health.    Review of Systems  Respiratory: Negative.   Cardiovascular: Negative.   Gastrointestinal: Negative.   Musculoskeletal: Positive for back pain.  Neurological: Negative.   Hematological: Negative.   Hx restless leg syndrome- check iron panel     Objective:   Physical Exam  Constitutional: She is oriented to person, place, and time. She appears well-developed and well-nourished.  HENT:  Head: Normocephalic and atraumatic.  Right Ear: External ear normal.  Left Ear: External ear normal.  Mouth/Throat: Oropharynx is clear and moist.  Eyes: Conjunctivae and EOM  are normal. Pupils are equal, round, and reactive to light. Right eye exhibits no discharge. Left eye exhibits no discharge. No scleral icterus.  Neck: Neck supple. No JVD present. No thyromegaly present.  Cardiovascular: Normal rate, regular rhythm, normal heart sounds and intact distal pulses.  No murmur heard. Pulmonary/Chest: Effort normal and breath sounds normal. She has no wheezes. She has no rales.  Abdominal: Soft. Bowel sounds are normal. She exhibits no distension and no mass. There is no tenderness. There is no rebound and no guarding.  Genitourinary:  Genitourinary Comments: Pap taken.  Bimanual normal  Musculoskeletal: She exhibits no edema.  Lymphadenopathy:    She has no cervical adenopathy.  Neurological: She is alert and oriented to person, place, and time. No cranial nerve deficit. Coordination normal.  Skin: Skin is warm and dry. No rash noted.  Psychiatric: She has a normal mood and affect. Her behavior is normal. Judgment and thought content normal.  Vitals reviewed.         Assessment & Plan:  Normal health maintenance exam  History of anxiety  Low back pain left SI joint has been treated previously with prednisone and meloxicam  Restless leg syndrome  History of lymphocytic colitis-asymptomatic  History of osteopenia based on bone density study March 2015  Plan: Anxiety is treated with Klonopin and this was refilled for 6 months

## 2017-04-26 NOTE — Patient Instructions (Signed)
It was a pleasure to see you today.  Continue Klonopin for insomnia.  Iron panel check for restless leg syndrome.  Lab work drawn and pending.

## 2017-04-27 LAB — CBC WITH DIFFERENTIAL/PLATELET
BASOS PCT: 1 %
Basophils Absolute: 49 cells/uL (ref 0–200)
EOS PCT: 2.4 %
Eosinophils Absolute: 118 cells/uL (ref 15–500)
HEMATOCRIT: 40.9 % (ref 35.0–45.0)
Hemoglobin: 14 g/dL (ref 11.7–15.5)
LYMPHS ABS: 1916 {cells}/uL (ref 850–3900)
MCH: 32.1 pg (ref 27.0–33.0)
MCHC: 34.2 g/dL (ref 32.0–36.0)
MCV: 93.8 fL (ref 80.0–100.0)
MPV: 9.6 fL (ref 7.5–12.5)
Monocytes Relative: 7.9 %
NEUTROS ABS: 2430 {cells}/uL (ref 1500–7800)
NEUTROS PCT: 49.6 %
Platelets: 232 10*3/uL (ref 140–400)
RBC: 4.36 10*6/uL (ref 3.80–5.10)
RDW: 12.3 % (ref 11.0–15.0)
Total Lymphocyte: 39.1 %
WBC: 4.9 10*3/uL (ref 3.8–10.8)
WBCMIX: 387 {cells}/uL (ref 200–950)

## 2017-04-27 LAB — VITAMIN D 25 HYDROXY (VIT D DEFICIENCY, FRACTURES): VIT D 25 HYDROXY: 40 ng/mL (ref 30–100)

## 2017-04-27 LAB — TEST AUTHORIZATION

## 2017-04-27 LAB — LIPID PANEL
CHOL/HDL RATIO: 2.1 (calc) (ref <5.0)
Cholesterol: 207 mg/dL — ABNORMAL HIGH (ref <200)
HDL: 97 mg/dL (ref >50)
LDL Cholesterol (Calc): 94 mg/dL (calc)
NON-HDL CHOLESTEROL (CALC): 110 mg/dL (ref <130)
TRIGLYCERIDES: 69 mg/dL (ref <150)

## 2017-04-27 LAB — COMPLETE METABOLIC PANEL WITH GFR
AG Ratio: 2.2 (calc) (ref 1.0–2.5)
ALT: 19 U/L (ref 6–29)
AST: 18 U/L (ref 10–35)
Albumin: 4.8 g/dL (ref 3.6–5.1)
Alkaline phosphatase (APISO): 56 U/L (ref 33–130)
BUN: 13 mg/dL (ref 7–25)
CALCIUM: 9.5 mg/dL (ref 8.6–10.4)
CO2: 29 mmol/L (ref 20–32)
CREATININE: 0.65 mg/dL (ref 0.50–0.99)
Chloride: 102 mmol/L (ref 98–110)
GFR, EST AFRICAN AMERICAN: 110 mL/min/{1.73_m2} (ref >=60)
GFR, EST NON AFRICAN AMERICAN: 94 mL/min/{1.73_m2} (ref >=60)
GLOBULIN: 2.2 g/dL (ref 1.9–3.7)
Glucose, Bld: 93 mg/dL (ref 65–99)
Potassium: 4.3 mmol/L (ref 3.5–5.3)
SODIUM: 139 mmol/L (ref 135–146)
TOTAL PROTEIN: 7 g/dL (ref 6.1–8.1)
Total Bilirubin: 0.9 mg/dL (ref 0.2–1.2)

## 2017-04-27 LAB — IRON, TOTAL/TOTAL IRON BINDING CAP
%SAT: 27 % (ref 11–50)
Iron: 90 ug/dL (ref 45–160)
TIBC: 339 ug/dL (ref 250–450)

## 2017-04-27 LAB — TSH: TSH: 2.09 mIU/L (ref 0.40–4.50)

## 2017-04-30 ENCOUNTER — Telehealth: Payer: Self-pay

## 2017-04-30 LAB — CYTOLOGY - PAP: DIAGNOSIS: NEGATIVE

## 2017-04-30 NOTE — Telephone Encounter (Signed)
Pt is aware.  

## 2017-04-30 NOTE — Telephone Encounter (Signed)
-----   Message from Margaree MackintoshMary J Baxley, MD sent at 04/30/2017  3:05 PM EST ----- Please call pt. Pap is OK

## 2018-01-13 ENCOUNTER — Other Ambulatory Visit: Payer: Self-pay | Admitting: Internal Medicine

## 2018-01-13 DIAGNOSIS — Z1231 Encounter for screening mammogram for malignant neoplasm of breast: Secondary | ICD-10-CM

## 2018-02-05 ENCOUNTER — Ambulatory Visit
Admission: RE | Admit: 2018-02-05 | Discharge: 2018-02-05 | Disposition: A | Discharge status: Home / Self Care | Payer: BC Managed Care – PPO | Source: Ambulatory Visit | Attending: Internal Medicine | Admitting: Internal Medicine

## 2018-02-05 DIAGNOSIS — Z1231 Encounter for screening mammogram for malignant neoplasm of breast: Secondary | ICD-10-CM

## 2018-03-07 ENCOUNTER — Encounter: Payer: Self-pay | Admitting: Internal Medicine

## 2018-04-15 DIAGNOSIS — M549 Dorsalgia, unspecified: Secondary | ICD-10-CM | POA: Insufficient documentation

## 2018-05-02 DIAGNOSIS — M48061 Spinal stenosis, lumbar region without neurogenic claudication: Secondary | ICD-10-CM | POA: Insufficient documentation

## 2018-05-02 DIAGNOSIS — M51369 Other intervertebral disc degeneration, lumbar region without mention of lumbar back pain or lower extremity pain: Secondary | ICD-10-CM | POA: Insufficient documentation

## 2018-05-02 DIAGNOSIS — M419 Scoliosis, unspecified: Secondary | ICD-10-CM | POA: Insufficient documentation

## 2018-07-27 ENCOUNTER — Other Ambulatory Visit: Payer: Self-pay | Admitting: Internal Medicine

## 2018-08-15 ENCOUNTER — Other Ambulatory Visit: Payer: BC Managed Care – PPO | Admitting: Internal Medicine

## 2018-08-15 DIAGNOSIS — G8929 Other chronic pain: Secondary | ICD-10-CM

## 2018-08-15 DIAGNOSIS — E785 Hyperlipidemia, unspecified: Secondary | ICD-10-CM

## 2018-08-15 DIAGNOSIS — F419 Anxiety disorder, unspecified: Secondary | ICD-10-CM

## 2018-08-15 DIAGNOSIS — G4700 Insomnia, unspecified: Secondary | ICD-10-CM

## 2018-08-15 DIAGNOSIS — M545 Low back pain, unspecified: Secondary | ICD-10-CM

## 2018-08-15 DIAGNOSIS — M5440 Lumbago with sciatica, unspecified side: Secondary | ICD-10-CM

## 2018-08-15 DIAGNOSIS — Z0000 Encounter for general adult medical examination without abnormal findings: Secondary | ICD-10-CM

## 2018-08-15 DIAGNOSIS — G2581 Restless legs syndrome: Secondary | ICD-10-CM

## 2018-08-16 LAB — COMPLETE METABOLIC PANEL WITH GFR
AG RATIO: 2 (calc) (ref 1.0–2.5)
ALBUMIN MSPROF: 4.7 g/dL (ref 3.6–5.1)
ALT: 19 U/L (ref 6–29)
AST: 19 U/L (ref 10–35)
Alkaline phosphatase (APISO): 64 U/L (ref 37–153)
BUN: 21 mg/dL (ref 7–25)
CALCIUM: 9.7 mg/dL (ref 8.6–10.4)
CO2: 28 mmol/L (ref 20–32)
Chloride: 102 mmol/L (ref 98–110)
Creat: 0.9 mg/dL (ref 0.50–0.99)
GFR, EST AFRICAN AMERICAN: 78 mL/min/{1.73_m2} (ref >=60)
GFR, EST NON AFRICAN AMERICAN: 68 mL/min/{1.73_m2} (ref >=60)
Globulin: 2.3 g/dL (calc) (ref 1.9–3.7)
Glucose, Bld: 96 mg/dL (ref 65–99)
POTASSIUM: 4.4 mmol/L (ref 3.5–5.3)
Sodium: 139 mmol/L (ref 135–146)
TOTAL PROTEIN: 7 g/dL (ref 6.1–8.1)
Total Bilirubin: 0.9 mg/dL (ref 0.2–1.2)

## 2018-08-16 LAB — CBC WITH DIFFERENTIAL/PLATELET
ABSOLUTE MONOCYTES: 350 {cells}/uL (ref 200–950)
BASOS PCT: 1.1 %
Basophils Absolute: 58 cells/uL (ref 0–200)
EOS ABS: 90 {cells}/uL (ref 15–500)
Eosinophils Relative: 1.7 %
HCT: 40 % (ref 35.0–45.0)
HEMOGLOBIN: 13.9 g/dL (ref 11.7–15.5)
LYMPHS ABS: 1526 {cells}/uL (ref 850–3900)
MCH: 32.6 pg (ref 27.0–33.0)
MCHC: 34.8 g/dL (ref 32.0–36.0)
MCV: 93.7 fL (ref 80.0–100.0)
MPV: 9.5 fL (ref 7.5–12.5)
Monocytes Relative: 6.6 %
NEUTROS ABS: 3275 {cells}/uL (ref 1500–7800)
Neutrophils Relative %: 61.8 %
PLATELETS: 263 10*3/uL (ref 140–400)
RBC: 4.27 10*6/uL (ref 3.80–5.10)
RDW: 12.4 % (ref 11.0–15.0)
TOTAL LYMPHOCYTE: 28.8 %
WBC: 5.3 10*3/uL (ref 3.8–10.8)

## 2018-08-16 LAB — LIPID PANEL
Cholesterol: 200 mg/dL — ABNORMAL HIGH (ref <200)
HDL: 75 mg/dL (ref >=50)
LDL Cholesterol (Calc): 108 mg/dL (calc) — ABNORMAL HIGH
Non-HDL Cholesterol (Calc): 125 mg/dL (calc) (ref <130)
TRIGLYCERIDES: 80 mg/dL (ref <150)
Total CHOL/HDL Ratio: 2.7 (calc) (ref <5.0)

## 2018-08-16 LAB — TSH: TSH: 2.17 m[IU]/L (ref 0.40–4.50)

## 2018-08-16 LAB — VITAMIN D 25 HYDROXY (VIT D DEFICIENCY, FRACTURES): Vit D, 25-Hydroxy: 37 ng/mL (ref 30–100)

## 2018-08-19 ENCOUNTER — Encounter: Payer: BC Managed Care – PPO | Admitting: Internal Medicine

## 2018-08-21 ENCOUNTER — Other Ambulatory Visit: Payer: Self-pay

## 2018-08-21 ENCOUNTER — Encounter: Payer: Self-pay | Admitting: Internal Medicine

## 2018-08-21 ENCOUNTER — Ambulatory Visit (INDEPENDENT_AMBULATORY_CARE_PROVIDER_SITE_OTHER): Payer: BC Managed Care – PPO | Admitting: Internal Medicine

## 2018-08-21 VITALS — BP 100/70 | HR 66 | Ht 62.0 in | Wt 132.0 lb

## 2018-08-21 DIAGNOSIS — F5101 Primary insomnia: Secondary | ICD-10-CM

## 2018-08-21 DIAGNOSIS — M419 Scoliosis, unspecified: Secondary | ICD-10-CM | POA: Diagnosis not present

## 2018-08-21 DIAGNOSIS — Z0000 Encounter for general adult medical examination without abnormal findings: Secondary | ICD-10-CM

## 2018-08-21 DIAGNOSIS — Z8669 Personal history of other diseases of the nervous system and sense organs: Secondary | ICD-10-CM

## 2018-08-21 DIAGNOSIS — M8580 Other specified disorders of bone density and structure, unspecified site: Secondary | ICD-10-CM | POA: Diagnosis not present

## 2018-08-21 LAB — POCT URINALYSIS DIPSTICK
APPEARANCE: NEGATIVE
Bilirubin, UA: NEGATIVE
Glucose, UA: NEGATIVE
Ketones, UA: NEGATIVE
LEUKOCYTES UA: NEGATIVE
Nitrite, UA: NEGATIVE
Odor: NEGATIVE
PH UA: 6.5 (ref 5.0–8.0)
Protein, UA: NEGATIVE
RBC UA: NEGATIVE
Spec Grav, UA: 1.01 (ref 1.010–1.025)
UROBILINOGEN UA: 0.2 U/dL

## 2018-08-21 NOTE — Progress Notes (Signed)
Subjective:    Patient ID: Natasha Hale, female    DOB: 1956-05-10, 63 y.o.   MRN: [id number]  HPI 63 year old Female for health maintenance exam and evaluation of medical issues.  Anxiety stable on Klonopin.  Longstanding history of anxiety and insomnia.  History of left SI joint pain onset around January 2017.  She saw Dr. Marvel Plan, orthopedist in [place] and was diagnosed with L4-L5 spondylosis with lateral translation causing L4 nerve root pain.  Was diagnosed with scoliosis of the lumbar spine and left sciatica.  Mobic was recommended.  She subsequently saw a chiropractor.  Back pain started after moving to a different house unpacking boxes and moving furniture.  See below she has seen Dr. Jule Ser recently.  History of restless leg syndrome.  History of lymphocytic colitis.  History of stress and urge urinary incontinence.  Had Morton's neuroma removed from right foot by Dr. Lajoyce Corners 1999.  Has had colonoscopy by Dr. Loreta Ave in 2009 and is likely due for follow-up.  No known drug allergies.   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  Non-smoker.  Husband is Twana First who is also an Pensions consultant.  She has [name] who has cerebral palsy.  Considering retirement later this year.  Family history: Father died at age 53 of an MI.  Mother died at age 42 with cerebral hemorrhage.  One brother died of suicide.  2 brothers and 1 sister in good health.  Review of Systems Seen by Dr. Newell Coral for back pain. Has scoliosis L1-2 and L4-5 which is being monitored and is to follow up in November.  History of restless leg syndrome-iron level has been checked previously     Objective:   Physical Exam Vitals signs reviewed.  Constitutional:      General: She is not in acute distress.    Appearance: Normal appearance.  HENT:     Head: Normocephalic and atraumatic.     Right Ear: Tympanic membrane and ear canal normal.     Left Ear: Tympanic  membrane and ear canal normal.     Nose: Nose normal.     Mouth/Throat:     Mouth: Mucous membranes are moist.     Pharynx: No oropharyngeal exudate.  Eyes:     General: No scleral icterus.       Right eye: No discharge.        Left eye: No discharge.     Extraocular Movements: Extraocular movements intact.     Conjunctiva/sclera: Conjunctivae normal.     Pupils: Pupils are equal, round, and reactive to light.  Neck:     Musculoskeletal: Neck supple. No neck rigidity.     Vascular: No carotid bruit.     Comments: No thyromegaly Cardiovascular:     Rate and Rhythm: Normal rate and regular rhythm.     Heart sounds: Normal heart sounds. No murmur.  Pulmonary:     Effort: Pulmonary effort is normal. No respiratory distress.     Breath sounds: Normal breath sounds. No wheezing or rales.  Abdominal:     General: Bowel sounds are normal.     Palpations: Abdomen is soft. There is no mass.     Tenderness: There is no guarding or rebound.  Genitourinary:    Comments: Pap deferred--- last done 2018  Musculoskeletal:     Right lower leg: No edema.     Left lower leg: No edema.  Lymphadenopathy:  Cervical: No cervical adenopathy.  Skin:    General: Skin is warm and dry.  Neurological:     General: No focal deficit present.     Mental Status: She is alert and oriented to person, place, and time.     Cranial Nerves: No cranial nerve deficit.     Sensory: No sensory deficit.     Motor: No weakness.     Coordination: Coordination normal.  Psychiatric:        Mood and Affect: Mood normal.        Behavior: Behavior normal.        Thought Content: Thought content normal.        Judgment: Judgment normal.           Assessment & Plan:  History of anxiety treated with Klonopin.  Okay to refill.  Lumbar scoliosis seen by Dr. Toy Baker and is to follow-up in November  History of restless leg syndrome- iron level is been checked previously and was normal  History of lymphocytic  colitis-asymptomatic  History of osteopenia based on bone density study March 2015.  Needs to get another bone density study in the near future.  Plan: Return in 1 year or as needed.  Continue Klonopin for anxiety.  Bone density study ordered.

## 2018-09-06 ENCOUNTER — Encounter: Payer: Self-pay | Admitting: Internal Medicine

## 2018-09-06 NOTE — Patient Instructions (Addendum)
Follow-up with Dr. Newell Coral regarding scoliosis in November.  Continue Klonopin for anxiety.  Return in 1 year or as needed.  Bone density study ordered.

## 2018-11-25 ENCOUNTER — Other Ambulatory Visit: Payer: BC Managed Care – PPO

## 2018-11-26 ENCOUNTER — Other Ambulatory Visit: Payer: BC Managed Care – PPO

## 2019-01-30 ENCOUNTER — Ambulatory Visit
Admission: RE | Admit: 2019-01-30 | Discharge: 2019-01-30 | Disposition: A | Discharge status: Home / Self Care | Payer: BC Managed Care – PPO | Source: Ambulatory Visit | Attending: Internal Medicine | Admitting: Internal Medicine

## 2019-01-30 ENCOUNTER — Encounter: Payer: Self-pay | Admitting: Internal Medicine

## 2019-01-30 ENCOUNTER — Telehealth (INDEPENDENT_AMBULATORY_CARE_PROVIDER_SITE_OTHER): Payer: Self-pay | Admitting: Internal Medicine

## 2019-01-30 ENCOUNTER — Other Ambulatory Visit: Payer: Self-pay

## 2019-01-30 DIAGNOSIS — M8580 Other specified disorders of bone density and structure, unspecified site: Secondary | ICD-10-CM

## 2019-01-30 MED ORDER — IBANDRONATE SODIUM 150 MG PO TABS: 150.0000 mg | ORAL_TABLET | ORAL | 3 refills | Status: DC

## 2019-01-30 NOTE — Telephone Encounter (Signed)
Recent bone density test shows mild osteopenia. Called pt to discuss. Will try Boniva 150 mg once a month. Will see if tolerated. Take on empty stomach monthly aand no food for at least one hour. Repeat bone density in 2 years T score -1.6 and had been -0.9 in 2015.

## 2019-02-26 ENCOUNTER — Other Ambulatory Visit: Payer: Self-pay | Admitting: Internal Medicine

## 2019-02-26 MED ORDER — CLONAZEPAM 0.5 MG PO TABS: ORAL_TABLET | ORAL | 2 refills | Status: DC

## 2019-02-26 NOTE — Telephone Encounter (Signed)
Pt wants to get a refill on clonazePAM (KLONOPIN) 0.5 MG tablet, does she need a office visit for this?

## 2019-02-26 NOTE — Telephone Encounter (Signed)
Please call her OK to refill Needs CPE [place]. Pend and I will sign

## 2019-02-26 NOTE — Addendum Note (Signed)
Addended by: Mady Haagensen on: 02/26/2019 11:35 AM   Modules accepted: Orders

## 2019-02-26 NOTE — Telephone Encounter (Signed)
CPE scheduled  

## 2019-08-27 ENCOUNTER — Other Ambulatory Visit: Payer: Self-pay

## 2019-08-27 ENCOUNTER — Other Ambulatory Visit (HOSPITAL_COMMUNITY)
Admission: RE | Admit: 2019-08-27 | Discharge: 2019-08-27 | Disposition: A | Discharge status: Home / Self Care | Payer: BC Managed Care – PPO | Source: Ambulatory Visit | Attending: Internal Medicine | Admitting: Internal Medicine

## 2019-08-27 ENCOUNTER — Encounter: Payer: Self-pay | Admitting: Internal Medicine

## 2019-08-27 ENCOUNTER — Ambulatory Visit (INDEPENDENT_AMBULATORY_CARE_PROVIDER_SITE_OTHER): Payer: BC Managed Care – PPO | Admitting: Internal Medicine

## 2019-08-27 VITALS — BP 100/60 | HR 86 | Ht 62.0 in | Wt 132.0 lb

## 2019-08-27 DIAGNOSIS — Z0000 Encounter for general adult medical examination without abnormal findings: Secondary | ICD-10-CM | POA: Diagnosis not present

## 2019-08-27 DIAGNOSIS — Z8669 Personal history of other diseases of the nervous system and sense organs: Secondary | ICD-10-CM

## 2019-08-27 DIAGNOSIS — M8580 Other specified disorders of bone density and structure, unspecified site: Secondary | ICD-10-CM | POA: Diagnosis not present

## 2019-08-27 DIAGNOSIS — Z124 Encounter for screening for malignant neoplasm of cervix: Secondary | ICD-10-CM | POA: Diagnosis not present

## 2019-08-27 DIAGNOSIS — N3946 Mixed incontinence: Secondary | ICD-10-CM

## 2019-08-27 DIAGNOSIS — M47816 Spondylosis without myelopathy or radiculopathy, lumbar region: Secondary | ICD-10-CM

## 2019-08-27 DIAGNOSIS — Z1329 Encounter for screening for other suspected endocrine disorder: Secondary | ICD-10-CM | POA: Diagnosis not present

## 2019-08-27 DIAGNOSIS — F5101 Primary insomnia: Secondary | ICD-10-CM | POA: Diagnosis not present

## 2019-08-27 DIAGNOSIS — M419 Scoliosis, unspecified: Secondary | ICD-10-CM

## 2019-08-27 DIAGNOSIS — F411 Generalized anxiety disorder: Secondary | ICD-10-CM

## 2019-08-27 LAB — POCT URINALYSIS DIPSTICK
Appearance: NEGATIVE
Bilirubin, UA: NEGATIVE
Blood, UA: NEGATIVE
Glucose, UA: NEGATIVE
Ketones, UA: NEGATIVE
Leukocytes, UA: NEGATIVE
Nitrite, UA: NEGATIVE
Odor: NEGATIVE
Protein, UA: NEGATIVE
Spec Grav, UA: 1.02 (ref 1.010–1.025)
Urobilinogen, UA: 0.2 E.U./dL
pH, UA: 6.5 (ref 5.0–8.0)

## 2019-08-27 NOTE — Progress Notes (Signed)
   Subjective:    Patient ID: Natasha Hale, female    DOB: 1956-03-13, 64 y.o.   MRN: [id number]  HPI 64 year old Female for health maintenance exam and evaluation of medical issues.  Longstanding history of stress and urge urinary incontinence. Will refer to Urology for assessment.  Anxiety controlled with Klonopin.  Longstanding history of anxiety and insomnia.  Hx of osteopenia  Bone density T score -1.15 January 2019  Hx of low back pain and has seen Dr. Sherwood Gambler. Sees him once a year. Surgery  not advised.  History of L4-L5 spondylosis with lateral translation causing L4 nerve root pain.  Has been diagnosed with scoliosis of the lumbar spine and left sciatica.  History of restless leg syndrome.  Iron level has been checked previously.  History of lymphocytic colitis.  History of Morton's neuroma removed from right foot by Dr. Sharol Given in 1999.  Had colonoscopy by Dr. Collene Mares September 2019.  Adenomatous polyps removed.  Follow-up recommended in 3 years.  Social history: She works as an Forensic psychologist.  She is married.  Non-smoker.  Husband is Natasha Hale who is also an Forensic psychologist.  She has an adopted son from [place] who has cerebral palsy.  Family history: Father died at age 39 of an MI.  Mother died at age 99 with cerebral hemorrhage.  1 brother died of suicide.  2 brothers and 1 sister in good health.  No known drug allergies.  Labs drawn today and are pending.  Has had Shingrix vaccines.  Has had 150 vaccine 08/24/2019.  Tetanus immunization is up-to-date.  Had flu vaccine September 2020.  Total cholesterol is 222 with an LDL of 116.  A year ago total cholesterol was 200 with an LDL of 108.  Encourage diet exercise and weight loss and follow-up in 1 year.  TSH is normal.  Pap taken is normal.  CBC and c-Met normal.  History of bone density study August 2020 with T score -1.6 and left femoral neck.  Boniva recommended monthly.  Is on estrogen replacement per GYN consisting of Climara  and Prometrium.  Review of Systems longstanding history of back pain see above     Objective:   Physical Exam Blood pressure 100/60 pulse 86 , patient afebrile.  Pulse oximetry 96%.  Weight 132 pounds.  BMI 24.14.  Skin warm and dry.  Nodes none.  TMs are clear.  Neck is supple without JVD thyromegaly or carotid bruits.  Chest is clear to auscultation.  Breast without masses.  Cardiac exam regular rate and rhythm normal S1 and S2 without murmurs or gallops.  Abdomen soft nondistended without hepatosplenomegaly masses or tenderness.  Genitalia: N FDG.  Pap smear taken.  No masses on bimanual exam.  Rectovaginal confirms.  Extremities without edema.  Neuro no focal deficits.  Thought judgment and affect are normal.       Assessment & Plan:  History of anxiety-longstanding treated with Klonopin  History of stress and urge urinary incontinence-refer to urologist  History of adenomatous colon polyps followed by Dr. Collene Mares.  Osteopenia-Boniva has been prescribed  History of L4-L5 spondylosis and chronic back pain treated with meloxicam  History of lymphocytic colitis  History of restless leg syndrome  Plan: Referral to urologist regarding incontinence which is aggravating.  Continue Klonopin for anxiety.  Recommend continuing [address] for osteopenia.  Follow-up in 1 year or as needed.

## 2019-08-28 LAB — COMPLETE METABOLIC PANEL WITH GFR
AG Ratio: 2 (calc) (ref 1.0–2.5)
ALT: 20 U/L (ref 6–29)
AST: 20 U/L (ref 10–35)
Albumin: 4.8 g/dL (ref 3.6–5.1)
Alkaline phosphatase (APISO): 51 U/L (ref 37–153)
BUN: 14 mg/dL (ref 7–25)
CO2: 30 mmol/L (ref 20–32)
Calcium: 9.7 mg/dL (ref 8.6–10.4)
Chloride: 102 mmol/L (ref 98–110)
Creat: 0.68 mg/dL (ref 0.50–0.99)
GFR, Est African American: 106 mL/min/{1.73_m2} (ref >=60)
GFR, Est Non African American: 92 mL/min/{1.73_m2} (ref >=60)
Globulin: 2.4 g/dL (calc) (ref 1.9–3.7)
Glucose, Bld: 99 mg/dL (ref 65–99)
Potassium: 4.6 mmol/L (ref 3.5–5.3)
Sodium: 140 mmol/L (ref 135–146)
Total Bilirubin: 0.8 mg/dL (ref 0.2–1.2)
Total Protein: 7.2 g/dL (ref 6.1–8.1)

## 2019-08-28 LAB — CYTOLOGY - PAP
Adequacy: ABSENT
Comment: NEGATIVE
Diagnosis: NEGATIVE
High risk HPV: NEGATIVE

## 2019-08-28 LAB — CBC WITH DIFFERENTIAL/PLATELET
Absolute Monocytes: 416 cells/uL (ref 200–950)
Basophils Absolute: 60 cells/uL (ref 0–200)
Basophils Relative: 1.5 %
Eosinophils Absolute: 92 cells/uL (ref 15–500)
Eosinophils Relative: 2.3 %
HCT: 42.8 % (ref 35.0–45.0)
Hemoglobin: 14.8 g/dL (ref 11.7–15.5)
Lymphs Abs: 1324 cells/uL (ref 850–3900)
MCH: 32.9 pg (ref 27.0–33.0)
MCHC: 34.6 g/dL (ref 32.0–36.0)
MCV: 95.1 fL (ref 80.0–100.0)
MPV: 9.7 fL (ref 7.5–12.5)
Monocytes Relative: 10.4 %
Neutro Abs: 2108 cells/uL (ref 1500–7800)
Neutrophils Relative %: 52.7 %
Platelets: 226 10*3/uL (ref 140–400)
RBC: 4.5 10*6/uL (ref 3.80–5.10)
RDW: 12.3 % (ref 11.0–15.0)
Total Lymphocyte: 33.1 %
WBC: 4 10*3/uL (ref 3.8–10.8)

## 2019-08-28 LAB — LIPID PANEL
Cholesterol: 222 mg/dL — ABNORMAL HIGH (ref <200)
HDL: 85 mg/dL (ref >=50)
LDL Cholesterol (Calc): 116 mg/dL (calc) — ABNORMAL HIGH
Non-HDL Cholesterol (Calc): 137 mg/dL (calc) — ABNORMAL HIGH (ref <130)
Total CHOL/HDL Ratio: 2.6 (calc) (ref <5.0)
Triglycerides: 100 mg/dL (ref <150)

## 2019-08-28 LAB — TSH: TSH: 2.94 mIU/L (ref 0.40–4.50)

## 2019-09-07 ENCOUNTER — Encounter: Payer: Self-pay | Admitting: Internal Medicine

## 2019-09-07 NOTE — Patient Instructions (Addendum)
Referral to [facility] regarding incontinence.  Continue current medications and follow-up here in 1 year.  It was a pleasure to see you today

## 2019-11-10 ENCOUNTER — Other Ambulatory Visit: Payer: Self-pay

## 2019-11-10 ENCOUNTER — Encounter: Payer: Self-pay | Admitting: Internal Medicine

## 2019-11-10 ENCOUNTER — Telehealth: Payer: Self-pay

## 2019-11-10 ENCOUNTER — Ambulatory Visit: Payer: BC Managed Care – PPO | Admitting: Internal Medicine

## 2019-11-10 VITALS — BP 120/80 | HR 72 | Ht 62.0 in | Wt 134.0 lb

## 2019-11-10 DIAGNOSIS — J0100 Acute maxillary sinusitis, unspecified: Secondary | ICD-10-CM | POA: Diagnosis not present

## 2019-11-10 MED ORDER — AMOXICILLIN-POT CLAVULANATE 875-125 MG PO TABS: 1.0000 | ORAL_TABLET | Freq: Two times a day (BID) | ORAL | 0 refills | Status: DC

## 2019-11-10 NOTE — Telephone Encounter (Signed)
Patient called wanting to know if she could come in this afternoon.  She thinks she has a sinus infection for 1 week.  She can be reached at [phone]

## 2019-11-10 NOTE — Progress Notes (Signed)
   Subjective:    Patient ID: Natasha Hale, female    DOB: 1956-06-10, 64 y.o.   MRN: [id number]  HPI 64 year old Female seen today with complaint of maxillary sinus discomfort and tenderness. No known Covid -19 exposure. No fever or chills. No cough, fever, chills or myalgias. Symptoms present for a few days.    Review of Systems see above     Objective:   Physical Exam BP 120/80 pulse 72 pulse ox 97% weight 134 pounds, BMI 24.51  Skin is warm and dry.  No cervical adenopathy.  TMs are clear bilaterally.  Neck is supple.  Chest clear to auscultation without rales or wheezing.  Bilateral maxillary sinus tenderness to palpation.          Assessment & Plan:  Acute maxillary sinusitis  Plan: Augmentin 875 mg twice daily for 10 days.  Call if not better in 10 to 14 days or sooner if worse.

## 2019-11-10 NOTE — Telephone Encounter (Signed)
Has she been vaccinated for Covid? If so when? If  2 vaccines more than 2-3 weeks ago can come in. If not vaccinated, then virtual visit

## 2019-11-10 NOTE — Telephone Encounter (Signed)
Patient states that she has been fully vaccinated and her 2nd shot was on 09-14-19.   Patient is schedule to come in today at 230.

## 2019-11-21 ENCOUNTER — Encounter: Payer: Self-pay | Admitting: Internal Medicine

## 2019-11-21 NOTE — Patient Instructions (Signed)
Take Augmentin 875 mg twice daily for 10 days.  Call if not better in 10 to 14 days or sooner if worse.

## 2020-06-20 ENCOUNTER — Other Ambulatory Visit: Payer: Self-pay | Admitting: Internal Medicine

## 2020-06-20 DIAGNOSIS — Z1231 Encounter for screening mammogram for malignant neoplasm of breast: Secondary | ICD-10-CM

## 2020-06-22 ENCOUNTER — Ambulatory Visit: Payer: BC Managed Care – PPO

## 2020-08-02 ENCOUNTER — Ambulatory Visit
Admission: RE | Admit: 2020-08-02 | Discharge: 2020-08-02 | Disposition: A | Discharge status: Home / Self Care | Payer: BC Managed Care – PPO | Source: Ambulatory Visit | Attending: Internal Medicine | Admitting: Internal Medicine

## 2020-08-02 ENCOUNTER — Other Ambulatory Visit: Payer: Self-pay

## 2020-08-02 DIAGNOSIS — Z1231 Encounter for screening mammogram for malignant neoplasm of breast: Secondary | ICD-10-CM

## 2020-08-09 ENCOUNTER — Other Ambulatory Visit: Payer: Self-pay | Admitting: Internal Medicine

## 2020-08-09 DIAGNOSIS — R928 Other abnormal and inconclusive findings on diagnostic imaging of breast: Secondary | ICD-10-CM

## 2020-08-22 ENCOUNTER — Ambulatory Visit
Admission: RE | Admit: 2020-08-22 | Discharge: 2020-08-22 | Disposition: A | Discharge status: Home / Self Care | Payer: BC Managed Care – PPO | Source: Ambulatory Visit | Attending: Internal Medicine | Admitting: Internal Medicine

## 2020-08-22 ENCOUNTER — Other Ambulatory Visit: Payer: Self-pay

## 2020-08-22 DIAGNOSIS — R928 Other abnormal and inconclusive findings on diagnostic imaging of breast: Secondary | ICD-10-CM

## 2020-09-06 ENCOUNTER — Other Ambulatory Visit: Payer: Self-pay | Admitting: Internal Medicine

## 2020-09-06 MED ORDER — CLONAZEPAM 0.5 MG PO TABS: ORAL_TABLET | ORAL | 2 refills | Status: DC

## 2020-09-06 NOTE — Telephone Encounter (Signed)
Pend for 90 days and I will sign

## 2020-09-06 NOTE — Telephone Encounter (Signed)
Pt called and wanted to get a refill on clonazePAM (KLONOPIN) 0.5 MG tablet. I scheduled her for her CPE since she is due in the next available slot which is 10/27/20 at 3pm

## 2020-09-30 ENCOUNTER — Telehealth: Payer: Self-pay

## 2020-09-30 NOTE — Telephone Encounter (Signed)
Coming in 4/25 at 12:00pm offered her 10:45am she could not do that.

## 2020-09-30 NOTE — Telephone Encounter (Signed)
Next week

## 2020-09-30 NOTE — Telephone Encounter (Signed)
Started as a lump under her skin, noticed it 2-3 weeks ago and it was itchy. She started scratching it and now is bleeding. She would like to be seen for that she thinks it's cancerous. Her brother had skin cancer. When would you like to work her in? She said she does not have a dermatologist.

## 2020-10-03 ENCOUNTER — Ambulatory Visit: Payer: BC Managed Care – PPO | Admitting: Internal Medicine

## 2020-10-03 ENCOUNTER — Encounter: Payer: Self-pay | Admitting: Internal Medicine

## 2020-10-03 ENCOUNTER — Other Ambulatory Visit: Payer: Self-pay

## 2020-10-03 VITALS — BP 130/80 | HR 60 | Ht 62.0 in | Wt 133.0 lb

## 2020-10-03 DIAGNOSIS — L02434 Carbuncle of left upper limb: Secondary | ICD-10-CM | POA: Diagnosis not present

## 2020-10-03 DIAGNOSIS — F411 Generalized anxiety disorder: Secondary | ICD-10-CM

## 2020-10-03 MED ORDER — MUPIROCIN 2 % EX OINT: 1.0000 "application " | TOPICAL_OINTMENT | Freq: Two times a day (BID) | CUTANEOUS | 0 refills | Status: DC

## 2020-10-03 NOTE — Patient Instructions (Addendum)
Tetanus immunization is up-to-date.  Apply generic Bactroban ointment twice daily to the left arm lesion until healed.  Call if not healing in 7 to 10 days or sooner if worse.  Patient reassured that this does not appear to be a cancerous lesion.

## 2020-10-03 NOTE — Progress Notes (Signed)
   Subjective:    Patient ID: Natasha Hale, female    DOB: [birth year], 65 y.o.   MRN: [id number]  HPI 65 year old female with history of anxiety disorder seen today regarding small papule left arm near her deltoid muscle area axillary line that she was concerned could possibly represent cancer.  It sort of suddenly appeared.  She did not recall any injury or insect bite.  She called on April 22 asking for an appointment or advice and we advised appointment today.  Over the weekend it has gotten better.  The papule is actually gotten smaller.  No history of skin cancer.    Review of Systems see Alphonzo Cruise tells me that her son who has cerebral palsy and lives at home has had issues with staph infection recently on his legs.     Objective:   Physical Exam  Vital signs reviewed.  She is afebrile.  Blood pressure 130/80 pulse 60 regular pulse oximetry 98% weight 133 pounds BMI 24.3 300 there is a 4 mm in diameter lesion that is erythematous mid lower deltoid of left arm.  No nodule is present at the present time.  Patient says it has disappeared.  There is no purulent drainage from this area.      Assessment & Plan:  Not sure if this is an insect bite.  Patient denies injury.  Could well be a small carbuncle with history of staph infection in her son.  History of anxiety disorder treated with Klonopin 0.5 mg twice daily as needed  Plan: She was given generic Bactroban ointment to apply to this area twice daily until healed.  Her tetanus immunization is up-to-date.  She will call if not improving/healing within 7 to 10 days.

## 2020-10-18 ENCOUNTER — Other Ambulatory Visit: Payer: Self-pay

## 2020-10-18 ENCOUNTER — Other Ambulatory Visit: Payer: BC Managed Care – PPO | Admitting: Internal Medicine

## 2020-10-18 DIAGNOSIS — M419 Scoliosis, unspecified: Secondary | ICD-10-CM

## 2020-10-18 DIAGNOSIS — M8580 Other specified disorders of bone density and structure, unspecified site: Secondary | ICD-10-CM

## 2020-10-18 DIAGNOSIS — E785 Hyperlipidemia, unspecified: Secondary | ICD-10-CM

## 2020-10-18 DIAGNOSIS — Z0000 Encounter for general adult medical examination without abnormal findings: Secondary | ICD-10-CM

## 2020-10-18 DIAGNOSIS — N3946 Mixed incontinence: Secondary | ICD-10-CM

## 2020-10-18 DIAGNOSIS — Z1329 Encounter for screening for other suspected endocrine disorder: Secondary | ICD-10-CM

## 2020-10-19 LAB — COMPLETE METABOLIC PANEL WITH GFR
AG Ratio: 1.9 (calc) (ref 1.0–2.5)
ALT: 19 U/L (ref 6–29)
AST: 19 U/L (ref 10–35)
Albumin: 4.6 g/dL (ref 3.6–5.1)
Alkaline phosphatase (APISO): 61 U/L (ref 37–153)
BUN: 15 mg/dL (ref 7–25)
CO2: 30 mmol/L (ref 20–32)
Calcium: 9.6 mg/dL (ref 8.6–10.4)
Chloride: 103 mmol/L (ref 98–110)
Creat: 0.72 mg/dL (ref 0.50–0.99)
GFR, Est African American: 101 mL/min/{1.73_m2} (ref >=60)
GFR, Est Non African American: 87 mL/min/{1.73_m2} (ref >=60)
Globulin: 2.4 g/dL (calc) (ref 1.9–3.7)
Glucose, Bld: 91 mg/dL (ref 65–99)
Potassium: 4.5 mmol/L (ref 3.5–5.3)
Sodium: 140 mmol/L (ref 135–146)
Total Bilirubin: 0.8 mg/dL (ref 0.2–1.2)
Total Protein: 7 g/dL (ref 6.1–8.1)

## 2020-10-19 LAB — CBC WITH DIFFERENTIAL/PLATELET
Absolute Monocytes: 312 cells/uL (ref 200–950)
Basophils Absolute: 48 cells/uL (ref 0–200)
Basophils Relative: 1.1 %
Eosinophils Absolute: 92 cells/uL (ref 15–500)
Eosinophils Relative: 2.1 %
HCT: 42 % (ref 35.0–45.0)
Hemoglobin: 14 g/dL (ref 11.7–15.5)
Lymphs Abs: 1769 cells/uL (ref 850–3900)
MCH: 31.7 pg (ref 27.0–33.0)
MCHC: 33.3 g/dL (ref 32.0–36.0)
MCV: 95.2 fL (ref 80.0–100.0)
MPV: 10 fL (ref 7.5–12.5)
Monocytes Relative: 7.1 %
Neutro Abs: 2178 cells/uL (ref 1500–7800)
Neutrophils Relative %: 49.5 %
Platelets: 227 10*3/uL (ref 140–400)
RBC: 4.41 10*6/uL (ref 3.80–5.10)
RDW: 12.5 % (ref 11.0–15.0)
Total Lymphocyte: 40.2 %
WBC: 4.4 10*3/uL (ref 3.8–10.8)

## 2020-10-19 LAB — LIPID PANEL
Cholesterol: 221 mg/dL — ABNORMAL HIGH (ref <200)
HDL: 85 mg/dL (ref >=50)
LDL Cholesterol (Calc): 118 mg/dL (calc) — ABNORMAL HIGH
Non-HDL Cholesterol (Calc): 136 mg/dL (calc) — ABNORMAL HIGH (ref <130)
Total CHOL/HDL Ratio: 2.6 (calc) (ref <5.0)
Triglycerides: 79 mg/dL (ref <150)

## 2020-10-19 LAB — TSH: TSH: 2.88 mIU/L (ref 0.40–4.50)

## 2020-10-27 ENCOUNTER — Ambulatory Visit (INDEPENDENT_AMBULATORY_CARE_PROVIDER_SITE_OTHER): Payer: BC Managed Care – PPO | Admitting: Internal Medicine

## 2020-10-27 ENCOUNTER — Encounter: Payer: Self-pay | Admitting: Internal Medicine

## 2020-10-27 ENCOUNTER — Other Ambulatory Visit: Payer: Self-pay

## 2020-10-27 VITALS — BP 110/80 | HR 74 | Ht 62.0 in | Wt 134.0 lb

## 2020-10-27 DIAGNOSIS — M8580 Other specified disorders of bone density and structure, unspecified site: Secondary | ICD-10-CM | POA: Diagnosis not present

## 2020-10-27 DIAGNOSIS — M47816 Spondylosis without myelopathy or radiculopathy, lumbar region: Secondary | ICD-10-CM

## 2020-10-27 DIAGNOSIS — F5101 Primary insomnia: Secondary | ICD-10-CM

## 2020-10-27 DIAGNOSIS — K5901 Slow transit constipation: Secondary | ICD-10-CM

## 2020-10-27 DIAGNOSIS — F411 Generalized anxiety disorder: Secondary | ICD-10-CM

## 2020-10-27 DIAGNOSIS — E7800 Pure hypercholesterolemia, unspecified: Secondary | ICD-10-CM

## 2020-10-27 DIAGNOSIS — Z0000 Encounter for general adult medical examination without abnormal findings: Secondary | ICD-10-CM

## 2020-10-27 DIAGNOSIS — M419 Scoliosis, unspecified: Secondary | ICD-10-CM

## 2020-10-27 DIAGNOSIS — N3946 Mixed incontinence: Secondary | ICD-10-CM

## 2020-10-27 DIAGNOSIS — Z8669 Personal history of other diseases of the nervous system and sense organs: Secondary | ICD-10-CM

## 2020-10-27 LAB — POCT URINALYSIS DIPSTICK
Appearance: NEGATIVE
Bilirubin, UA: NEGATIVE
Blood, UA: NEGATIVE
Glucose, UA: NEGATIVE
Ketones, UA: NEGATIVE
Leukocytes, UA: NEGATIVE
Nitrite, UA: NEGATIVE
Odor: NEGATIVE
Protein, UA: NEGATIVE
Spec Grav, UA: 1.01 (ref 1.010–1.025)
Urobilinogen, UA: 0.2 E.U./dL
pH, UA: 6 (ref 5.0–8.0)

## 2020-10-27 MED ORDER — ROSUVASTATIN CALCIUM 5 MG PO TABS: ORAL_TABLET | ORAL | 1 refills | Status: DC

## 2020-10-27 NOTE — Progress Notes (Signed)
Subjective:    Patient ID: Natasha Hale, female    DOB: 1955-06-15, 65 y.o.   MRN: [id number]  HPI  65 year old Female for health maintenance exam and evaluation of medical issues.  She is not on Medicare at the present time.  She continues to work full-time.  History of anxiety treated with Klonopin.  Longstanding history of anxiety and insomnia.  Longstanding history of stress and urge urinary incontinence.  History of osteopenia treated with Boniva.  Bone density T score was -1.6 in August 2020.  History of low back pain and has seen Dr. Newell Coral in the past.  Surgery was not advised.  History of L4-L5 spondylosis with lateral translation causing L4 nerve root pain.  Has been diagnosed with scoliosis of the lumbar spine and left sciatica.  History of restless leg syndrome.  Iron level has been checked previously.  History of lymphocytic colitis.  History of Morton's neuroma removed from right foot by Dr. Lajoyce Corners in 1999.  Had colonoscopy with Dr. Loreta Ave in September 2019.  3 adenomatous polyps were removed and follow-up recommended in 3 years.  She needs to call Dr. Kenna Gilbert office.  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  Non-smoker.  Husband is Twana First who is also an Pensions consultant.  She has [name] who has cerebral palsy.  No known drug allergies.  Has had 2  Shingrix vaccines.  Tetanus immunization is up-to-date.  Pneumococcal 23 vaccine given today.  Found to have T score of -1.6 in the left femoral neck on bone density study August 2020.  Boniva was recommended.  Has been on estrogen replacement per GYN consisting of Climara and Prometrium.  No longer on estrogen replacement.  Last bone density study was in 2020.  Lowest T score was in the left femoral neck and score was -1.7.  Needs to have another bone density study.  Recent mammogram revealed possible mass in left breast and tomography had to be performed.  Mass corresponded with 2  benign lymph nodes.    Review of Systems constipation- will try Benefiber. Colonoscopy. Hx of low back pain- sees specialist     Objective:   Physical Exam Vital signs reviewed.  BMI 24.51 weight 134 pounds height 5 feet 2 inches pulse oximetry 96%.  Skin: Warm and dry.  No cervical adenopathy.  TMs are clear.  Neck supple.  No thyromegaly or carotid bruits.  Chest clear to auscultation.  Cardiac exam: Regular rate and rhythm without murmurs.  Breast fibrocystic changes diffusely in the left breast and lesser amounts in the right breast.  Cardiac exam: Regular rate and rhythm normal S1 and S2 without murmurs or gallops.  Abdomen soft nondistended without hepatosplenomegaly masses or tenderness.  Pap smear was deferred having been done in March 2021.  Bimanual exam is normal.  Rectovaginal confirms.  No lower extremity edema.  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  These are not causing any problems and this was discussed with her today.  She may have been treated for cosmetic reasons.       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  These are not causing any issues such as predisposition to clotting.  Patient was told she could see vascular physician for treatment if she so desires but says she does not currently desire to do so.  History of anxiety treated with Klonopin  History of adenomatous colon polyps and colonoscopy  is due this year by Dr. Loreta Ave.  History of lymphocytic colitis.  History of restless leg syndrome.  Osteopenia treated with Boniva  History of L4-L5 spondylosis with chronic back pain treated with meloxicam  History of stress and urge urinary incontinence  Plan: Continue current medications-Klonopin and Boniva.  Labs are reviewed.  Total cholesterol is 222, HDL 85, triglycerides 100 and LDL cholesterol 116.  There is family history of heart disease in her father.  I would like for her to start  Crestor 5 mg 3 times a week with supper and follow-up in August with lipid panel liver functions and office visit.  She should contact Dr. Loreta Ave about repeat colonoscopy.  Pneumococcal 23 vaccine given today.

## 2020-10-27 NOTE — Patient Instructions (Signed)
Begin Crestor 5 mg 3 times a week at supper and follow-up in August with lipid panel liver functions and office visit.  Continue Klonopin for anxiety.  Continue Boniva for osteopenia.  Pneumococcal 23 vaccine given today.  Colonoscopy is now due with Dr. Quincy Sheehan.

## 2021-01-26 ENCOUNTER — Encounter: Payer: Self-pay | Admitting: Internal Medicine

## 2021-01-26 ENCOUNTER — Other Ambulatory Visit: Payer: Self-pay

## 2021-01-26 ENCOUNTER — Ambulatory Visit: Payer: BC Managed Care – PPO | Admitting: Internal Medicine

## 2021-01-26 VITALS — BP 110/70 | HR 76 | Ht 62.0 in | Wt 136.0 lb

## 2021-01-26 DIAGNOSIS — E7800 Pure hypercholesterolemia, unspecified: Secondary | ICD-10-CM | POA: Diagnosis not present

## 2021-01-26 NOTE — Progress Notes (Deleted)
   Subjective:    Patient ID: Natasha Hale, female    DOB: Nov 30, 1955, 65 y.o.   MRN: [id number]  HPI 65 year old Female seen today for        Review of Systems     Objective:   Physical Exam        Assessment & Plan:

## 2021-03-01 NOTE — Patient Instructions (Addendum)
Continue to watch diet and get some exercise.  I would prefer that you take Crestor 5 mg 3 times a week with family history of heart disease.  We will recheck lipid panel in 2 months.  Your health maintenance exam has been scheduled for May 2023

## 2021-03-01 NOTE — Progress Notes (Signed)
   Subjective:    Patient ID: Natasha Hale, female    DOB: 05/23/1956, 65 y.o.   MRN: [id number]  HPI 65 year old Female seen for health maintenance exam and is now here for follow-up.  She has a history of anxiety treated with Klonopin.  History of stress and urge urinary incontinence.  History of osteopenia treated with Boniva.  At last visit she was found to have elevated cholesterol of 222, HDL cholesterol 85, triglycerides 100 and LDL cholesterol 116.  There is a family history of heart disease in father and asked her to start Crestor 5 mg 3 times a week and follow-up today with labs and discussion.  Also reminded her to contact Dr. Loreta Ave about repeat colonoscopy.  We also gave her pneumococcal 23 vaccine when she was here in May.  Tells me today that she has not been consistent with taking her lipid-lowering medication.  So therefore it is not going to be of help to repeat her lipid panel and liver functions.  I think she has some reservations about taking it even though it is only 5 mg 3 times a week and she has family history in her father.  Her diet was discussed in detail today.  She tries to eat healthy.  Tries to get some exercise but maybe not as much recently.  Review of Systems     Objective:   Physical Exam Blood pressure 110/70 pulse 76 pulse oximetry 97% weight 136 pounds height 5 feet 2 inches BMI 24.87  Seen today in no acute distress for discussion only regarding lipid management, diet and exercise habits     Assessment & Plan:  Pure hypercholesterolemia  Plan: Patient has follow-up appointment in October for lipid panel.  If she is taking Crestor we can do liver functions if not we will just follow-up with lipid panel.  Also, we have a health maintenance exam appointment and lab appointment for May 2023.

## 2021-03-02 ENCOUNTER — Telehealth: Payer: BC Managed Care – PPO | Admitting: Internal Medicine

## 2021-03-02 NOTE — Telephone Encounter (Signed)
Called patient and she is going to go to [facility] the [facility]

## 2021-03-02 NOTE — Telephone Encounter (Signed)
Natasha Hale [phone]  Alvino Chapel called to say the top of her left foot is sore, red and swollen, it has been that way since Labor Day and is not getting better. She stated it hurts and affects the way she walks. She dropped some black nail polish that day but does not remember if it hit her foot, she was more concerned with cleaning up the mess. Does she need to come here or go to Ortho?

## 2021-03-24 ENCOUNTER — Encounter: Payer: Self-pay | Admitting: Internal Medicine

## 2021-03-30 ENCOUNTER — Other Ambulatory Visit: Payer: BC Managed Care – PPO | Admitting: Internal Medicine

## 2021-03-30 ENCOUNTER — Other Ambulatory Visit: Payer: Self-pay

## 2021-03-30 DIAGNOSIS — E7800 Pure hypercholesterolemia, unspecified: Secondary | ICD-10-CM

## 2021-03-31 LAB — LIPID PANEL
Cholesterol: 209 mg/dL — ABNORMAL HIGH (ref <200)
HDL: 94 mg/dL (ref >=50)
LDL Cholesterol (Calc): 98 mg/dL (calc)
Non-HDL Cholesterol (Calc): 115 mg/dL (calc) (ref <130)
Total CHOL/HDL Ratio: 2.2 (calc) (ref <5.0)
Triglycerides: 83 mg/dL (ref <150)

## 2021-04-10 ENCOUNTER — Other Ambulatory Visit: Payer: Self-pay | Admitting: Internal Medicine

## 2021-04-13 ENCOUNTER — Other Ambulatory Visit: Payer: Self-pay | Admitting: Internal Medicine

## 2021-04-13 NOTE — Telephone Encounter (Signed)
Natasha Hale [phone]  Natasha Hale called to say she needs refill on below medication.  clonazePAM (KLONOPIN) 0.5 MG tablet  [id number] - [place], [place] - [address] Phone:  [phone]  Fax:  [phone]

## 2021-04-13 NOTE — Telephone Encounter (Signed)
Pended and sent to provider for signing

## 2021-04-13 NOTE — Telephone Encounter (Signed)
Duplicate

## 2021-08-18 ENCOUNTER — Other Ambulatory Visit: Payer: Self-pay | Admitting: Family Medicine

## 2021-08-18 ENCOUNTER — Other Ambulatory Visit: Payer: Self-pay | Admitting: Internal Medicine

## 2021-08-18 DIAGNOSIS — Z1231 Encounter for screening mammogram for malignant neoplasm of breast: Secondary | ICD-10-CM

## 2021-08-24 ENCOUNTER — Ambulatory Visit
Admission: RE | Admit: 2021-08-24 | Discharge: 2021-08-24 | Disposition: A | Discharge status: Home / Self Care | Payer: BC Managed Care – PPO | Source: Ambulatory Visit | Attending: Internal Medicine | Admitting: Internal Medicine

## 2021-10-24 ENCOUNTER — Telehealth: Payer: Self-pay | Admitting: Internal Medicine

## 2021-10-24 NOTE — Telephone Encounter (Signed)
Ruthell Rummage ?[phone] ? ?Kenitra called to reschedule CPE, I rescheduled for December. Last May her lipids were off, when I ask her about if she was going to need medication she stated she was not taking her medication so she would not need anything before December. Would there be any need for her to come in for labs and office visit before then? ?

## 2021-10-26 ENCOUNTER — Other Ambulatory Visit: Payer: BC Managed Care – PPO

## 2021-10-26 DIAGNOSIS — F411 Generalized anxiety disorder: Secondary | ICD-10-CM

## 2021-10-26 DIAGNOSIS — R5383 Other fatigue: Secondary | ICD-10-CM

## 2021-10-26 DIAGNOSIS — E7800 Pure hypercholesterolemia, unspecified: Secondary | ICD-10-CM

## 2021-11-02 ENCOUNTER — Encounter: Payer: BC Managed Care – PPO | Admitting: Internal Medicine

## 2021-11-07 IMAGING — US US BREAST*L* LIMITED INC AXILLA
1 series · 5 of 5 positions shown · non-contrast
Comparison: Previous exam(s).

CLINICAL DATA: Screening recall for a possible left breast mass.

EXAM:
DIGITAL DIAGNOSTIC UNILATERAL LEFT MAMMOGRAM WITH TOMOSYNTHESIS AND
CAD; ULTRASOUND LEFT BREAST LIMITED
TECHNIQUE: Left digital diagnostic mammography and breast tomosynthesis was
performed. The images were evaluated with computer-aided detection.;
Targeted ultrasound examination of the left breast was performed

[Series 1: us breast*left* limited inc axilla · 0.06mm/px · 5 of 5 slices shown]
[im 1/5]
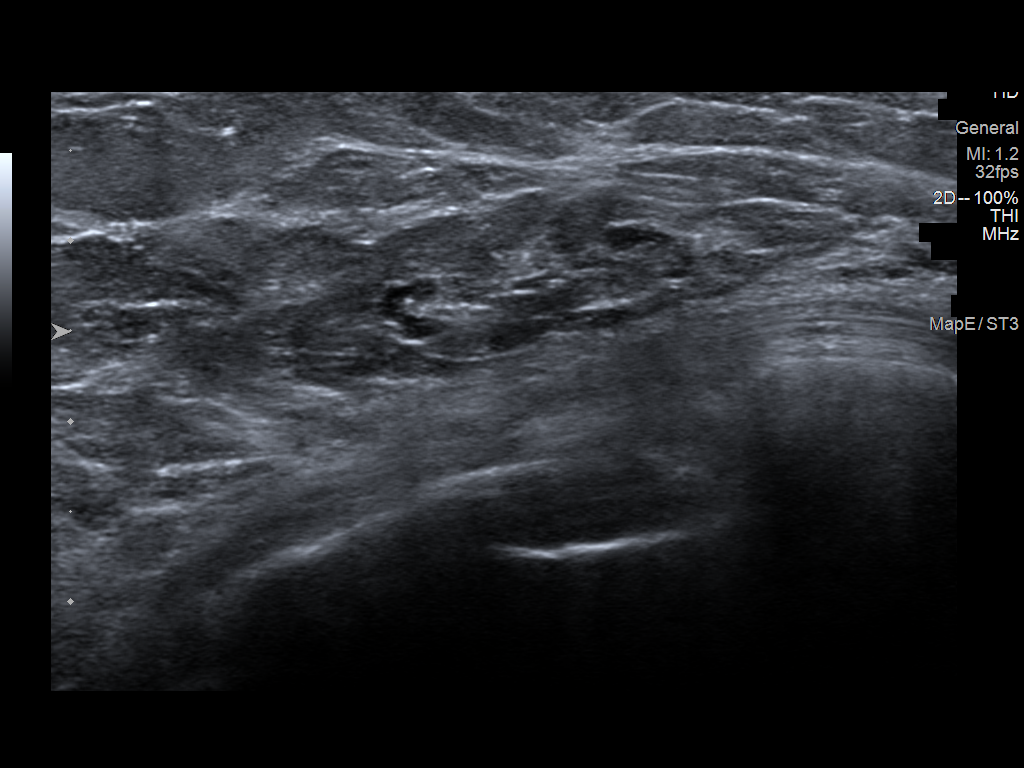
[im 2/5]
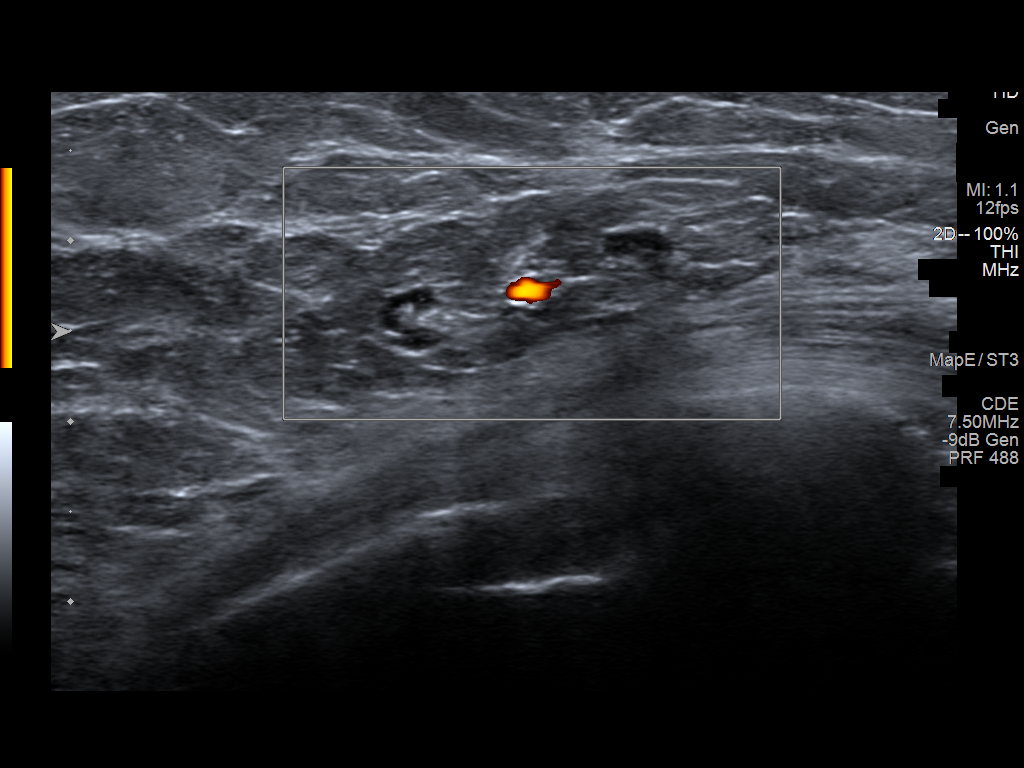
[im 3/5]
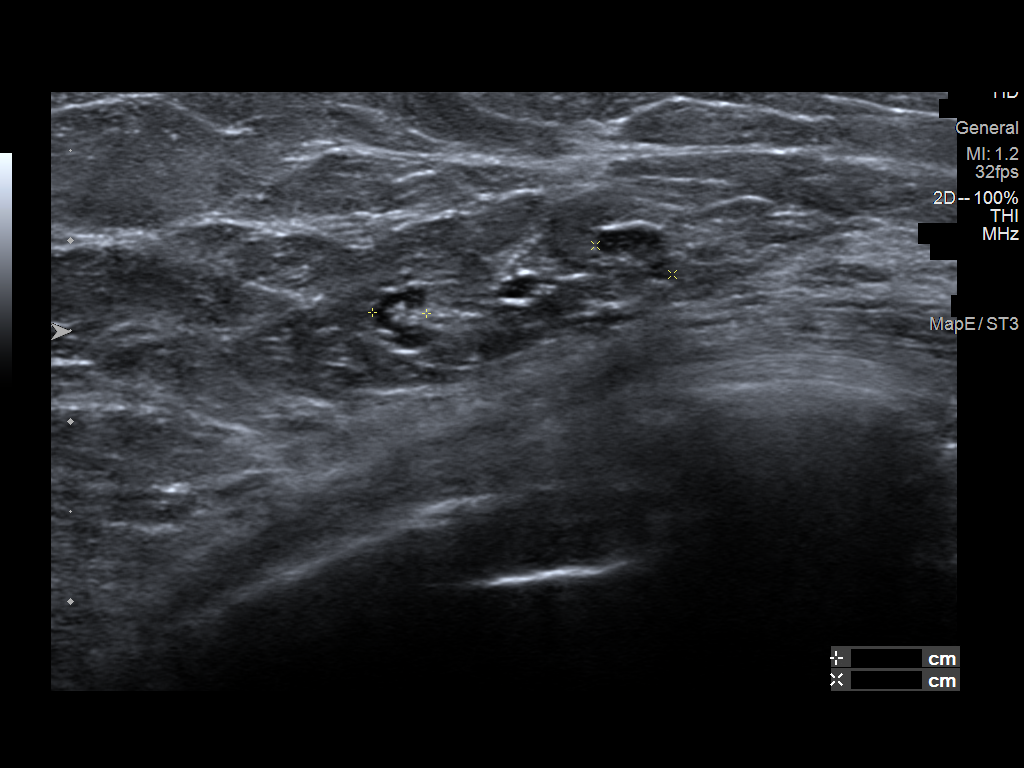
[im 4/5]
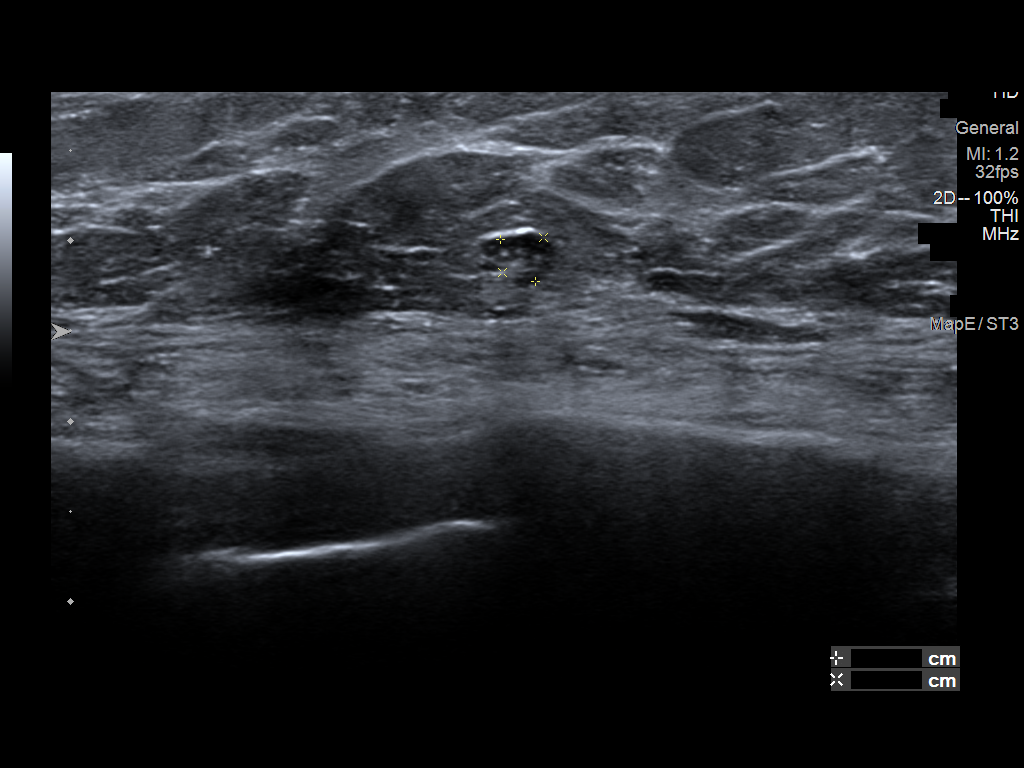
[im 5/5]
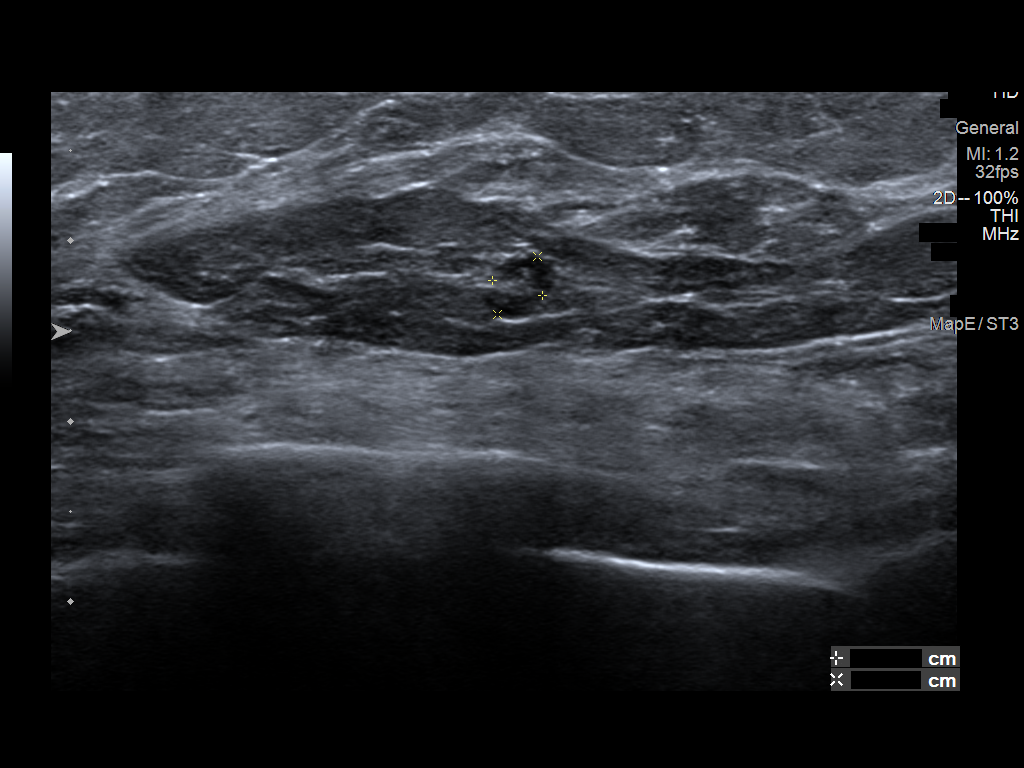

[5 of 5 positions shown; findings below may reference images not displayed]

ACR Breast Density Category b: There are scattered areas of
fibroglandular density.
FINDINGS: Spot compression tomosynthesis images demonstrate 2 adjacent small
masses in the lateral far posterior left breast.

Ultrasound targeted to the left breast at 3 o'clock, 12 cm from the
nipple demonstrates 2 adjacent reniform masses consistent with small
benign lymph nodes. They have preserved fatty hila and thin cortical
ribbons.
IMPRESSION: The masses on the screening mammogram correspond with benign lymph
nodes.

RECOMMENDATION:
Screening mammogram in one year.(Code:[id number])

I have discussed the findings and recommendations with the patient.
If applicable, a reminder letter will be sent to the patient
regarding the next appointment.

BI-RADS CATEGORY  1: Negative.

## 2022-05-18 ENCOUNTER — Other Ambulatory Visit: Payer: BC Managed Care – PPO

## 2022-05-18 DIAGNOSIS — R5383 Other fatigue: Secondary | ICD-10-CM

## 2022-05-18 DIAGNOSIS — F411 Generalized anxiety disorder: Secondary | ICD-10-CM

## 2022-05-18 DIAGNOSIS — E7800 Pure hypercholesterolemia, unspecified: Secondary | ICD-10-CM

## 2022-05-19 LAB — COMPLETE METABOLIC PANEL WITH GFR
AG Ratio: 2 (calc) (ref 1.0–2.5)
ALT: 16 U/L (ref 6–29)
AST: 18 U/L (ref 10–35)
Albumin: 4.8 g/dL (ref 3.6–5.1)
Alkaline phosphatase (APISO): 63 U/L (ref 37–153)
BUN: 16 mg/dL (ref 7–25)
CO2: 29 mmol/L (ref 20–32)
Calcium: 9.7 mg/dL (ref 8.6–10.4)
Chloride: 103 mmol/L (ref 98–110)
Creat: 0.68 mg/dL (ref 0.50–1.05)
Globulin: 2.4 g/dL (calc) (ref 1.9–3.7)
Glucose, Bld: 100 mg/dL — ABNORMAL HIGH (ref 65–99)
Potassium: 4.8 mmol/L (ref 3.5–5.3)
Sodium: 140 mmol/L (ref 135–146)
Total Bilirubin: 0.8 mg/dL (ref 0.2–1.2)
Total Protein: 7.2 g/dL (ref 6.1–8.1)
eGFR: 95 mL/min/{1.73_m2} (ref >=60)

## 2022-05-19 LAB — LIPID PANEL
Cholesterol: 217 mg/dL — ABNORMAL HIGH (ref <200)
HDL: 83 mg/dL (ref >=50)
LDL Cholesterol (Calc): 113 mg/dL (calc) — ABNORMAL HIGH
Non-HDL Cholesterol (Calc): 134 mg/dL (calc) — ABNORMAL HIGH (ref <130)
Total CHOL/HDL Ratio: 2.6 (calc) (ref <5.0)
Triglycerides: 106 mg/dL (ref <150)

## 2022-05-19 LAB — CBC WITH DIFFERENTIAL/PLATELET
Absolute Monocytes: 371 cells/uL (ref 200–950)
Basophils Absolute: 42 cells/uL (ref 0–200)
Basophils Relative: 0.8 %
Eosinophils Absolute: 111 cells/uL (ref 15–500)
Eosinophils Relative: 2.1 %
HCT: 41.8 % (ref 35.0–45.0)
Hemoglobin: 14.5 g/dL (ref 11.7–15.5)
Lymphs Abs: 1887 cells/uL (ref 850–3900)
MCH: 32.8 pg (ref 27.0–33.0)
MCHC: 34.7 g/dL (ref 32.0–36.0)
MCV: 94.6 fL (ref 80.0–100.0)
MPV: 9.7 fL (ref 7.5–12.5)
Monocytes Relative: 7 %
Neutro Abs: 2889 cells/uL (ref 1500–7800)
Neutrophils Relative %: 54.5 %
Platelets: 235 10*3/uL (ref 140–400)
RBC: 4.42 10*6/uL (ref 3.80–5.10)
RDW: 12.2 % (ref 11.0–15.0)
Total Lymphocyte: 35.6 %
WBC: 5.3 10*3/uL (ref 3.8–10.8)

## 2022-05-19 LAB — TSH: TSH: 3.15 mIU/L (ref 0.40–4.50)

## 2022-05-22 ENCOUNTER — Other Ambulatory Visit (HOSPITAL_COMMUNITY)
Admission: RE | Admit: 2022-05-22 | Discharge: 2022-05-22 | Disposition: A | Discharge status: Home / Self Care | Payer: BC Managed Care – PPO | Source: Ambulatory Visit | Attending: Internal Medicine | Admitting: Internal Medicine

## 2022-05-22 ENCOUNTER — Encounter: Payer: Self-pay | Admitting: Internal Medicine

## 2022-05-22 ENCOUNTER — Ambulatory Visit (INDEPENDENT_AMBULATORY_CARE_PROVIDER_SITE_OTHER): Payer: BC Managed Care – PPO | Admitting: Internal Medicine

## 2022-05-22 VITALS — BP 124/80 | HR 75 | Temp 98.8°F | Ht 62.0 in | Wt 138.1 lb

## 2022-05-22 DIAGNOSIS — F411 Generalized anxiety disorder: Secondary | ICD-10-CM | POA: Diagnosis not present

## 2022-05-22 DIAGNOSIS — Z8249 Family history of ischemic heart disease and other diseases of the circulatory system: Secondary | ICD-10-CM | POA: Diagnosis not present

## 2022-05-22 DIAGNOSIS — F5101 Primary insomnia: Secondary | ICD-10-CM

## 2022-05-22 DIAGNOSIS — M8580 Other specified disorders of bone density and structure, unspecified site: Secondary | ICD-10-CM | POA: Diagnosis not present

## 2022-05-22 DIAGNOSIS — Z0000 Encounter for general adult medical examination without abnormal findings: Secondary | ICD-10-CM

## 2022-05-22 DIAGNOSIS — E7800 Pure hypercholesterolemia, unspecified: Secondary | ICD-10-CM

## 2022-05-22 DIAGNOSIS — Z23 Encounter for immunization: Secondary | ICD-10-CM | POA: Diagnosis not present

## 2022-05-22 DIAGNOSIS — Z780 Asymptomatic menopausal state: Secondary | ICD-10-CM

## 2022-05-22 LAB — POCT URINALYSIS DIPSTICK
Bilirubin, UA: NEGATIVE
Blood, UA: NEGATIVE
Glucose, UA: NEGATIVE
Ketones, UA: NEGATIVE
Leukocytes, UA: NEGATIVE
Nitrite, UA: NEGATIVE
Protein, UA: NEGATIVE
Spec Grav, UA: 1.01 (ref 1.010–1.025)
Urobilinogen, UA: 0.2 E.U./dL
pH, UA: 7 (ref 5.0–8.0)

## 2022-05-22 NOTE — Progress Notes (Signed)
   Subjective:    Patient ID: Natasha Hale, female    DOB: February 22, 1956, 66 y.o.   MRN: [id number]  HPI 66 year old Female seen for health maintenance exam and evaluation of medical issues. Due for Pap smear today.   Has noted recently a rectal tag that she says has disappeared.I also do not see it today on inspection.  Had mammogram 2023. Had bone density in 2020. T score was -1.6. Suggest repeat study soon.  Has taken Boniva since 2020.  No longer on estrogen replacement. History of anxiety treated with Klonopin.  Anxiety is longstanding and she takes her medication reliably and responsibly.  History of stress and urge urinary incontinence.  History of low back pain and is seeing Dr. Ezzard Hale who has retired in the remote past.  Surgery was not advised.  History of L4-L5 spondylosis with lateral translation causing L4 nerve root pain.  Has been diagnosed with scoliosis of the lumbar spine and left sciatica.  History of restless leg syndrome.  Iron level has been checked previously and was normal.  History of lymphocytic colitis.  History of Morton's neuroma removed from right foot by Dr. Pamelia Hale in 1999.  Had colonoscopy by Dr. Loreta Hale in September 2019 and follow-up was recommended in 3 years.  She needs to contact Dr. Loreta Hale.  3 adenomatous polyps were removed in 2019.  No known drug allergies.  Vaccines discussed.  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  Non-smoker.  Husband is Natasha Hale, who is also an Pensions consultant.  She has [name] who has cerebral palsy.      Review of Systems had colonoscopy in 2022 and return in 2027.     Objective:   Physical Exam Temperature 98.8 degrees pulse oximetry 98% weight 138 pounds 1.9 ounces height 5 feet 2 inches blood pressure 124/88, pulse 75, BMI 25.26  Skin: Warm and dry.  No cervical adenopathy.  No thyromegaly.  No carotid bruits.  Chest clear.  Cardiac exam: Regular rate and rhythm without ectopy.   Abdomen soft nondistended without hepatosplenomegaly masses or tenderness.  GYN: Normal female external genitalia.  Pap taken which is normal.  Bimanual exam is normal.  No lower extremity pitting edema or deformity.  Brief neurological exam is intact without gross focal deficits.  Affect thought and judgment are normal.     Assessment & Plan:   Normal health maintenance exam  History of osteopenia treated with Boniva-bone density study due every 2 years.  One has been ordered since last one was done in 2020.  Due for this now.  History of anxiety treated with Klonopin and stable  Recently had coronary calcium scoring and result was 0 which is excellent  Recommend annual mammogram  Pure hypercholesterolemia-does not want to be on statin and work on diet and exercise.  Follow-up in 1 year.  Flu vaccine given   Vaccines discussed-due for pneumococcal 20 vaccine  Colonoscopy done in 2022 and follow-up recommended in 10 years  Plan: Continue current medications and follow-up in 1 year or as needed.

## 2022-05-24 LAB — CYTOLOGY - PAP
Comment: NEGATIVE
Diagnosis: NEGATIVE
High risk HPV: NEGATIVE

## 2022-05-29 ENCOUNTER — Ambulatory Visit (HOSPITAL_BASED_OUTPATIENT_CLINIC_OR_DEPARTMENT_OTHER)
Admission: RE | Admit: 2022-05-29 | Discharge: 2022-05-29 | Disposition: A | Discharge status: Home / Self Care | Payer: BC Managed Care – PPO | Source: Ambulatory Visit | Attending: Internal Medicine | Admitting: Internal Medicine

## 2022-05-29 DIAGNOSIS — Z8249 Family history of ischemic heart disease and other diseases of the circulatory system: Secondary | ICD-10-CM | POA: Insufficient documentation

## 2022-06-09 NOTE — Patient Instructions (Addendum)
It was a pleasure to see you today.  Labs are stable.  Coronary calcium score was excellent.  Please have pneumococcal 20 vaccine.  Other vaccines discussed.  Flu vaccine given.  Continue Boniva for bone loss.  Need to have bone density study in the very near future.  Continue Klonopin for anxiety.  Watch diet and continue to exercise.  Cholesterol is slightly elevated.  Return in 1 year or as needed.

## 2022-09-17 ENCOUNTER — Telehealth: Payer: Self-pay | Admitting: Internal Medicine

## 2022-09-17 NOTE — Telephone Encounter (Signed)
After talking with Dr Lenord Fellers she said for patient to go to [facility] and ask for Dr Charlsie Merles. I called and told patient she verbalized understanding.

## 2022-09-17 NOTE — Telephone Encounter (Signed)
Natasha Hale [phone]  [name] called to say when she goes barefoot her toes on her left foot hurt. It is not painful to touch, does not hurt when she wears shoes. She was wondering if she should go to foot doctor, ortho, or Retail buyer.

## 2022-09-19 ENCOUNTER — Ambulatory Visit: Payer: BC Managed Care – PPO | Admitting: Podiatry

## 2022-09-19 ENCOUNTER — Encounter: Payer: Self-pay | Admitting: Podiatry

## 2022-09-19 ENCOUNTER — Ambulatory Visit (INDEPENDENT_AMBULATORY_CARE_PROVIDER_SITE_OTHER): Payer: BC Managed Care – PPO

## 2022-09-19 DIAGNOSIS — M722 Plantar fascial fibromatosis: Secondary | ICD-10-CM

## 2022-09-19 DIAGNOSIS — M7752 Other enthesopathy of left foot: Secondary | ICD-10-CM

## 2022-09-19 DIAGNOSIS — D3610 Benign neoplasm of peripheral nerves and autonomic nervous system, unspecified: Secondary | ICD-10-CM

## 2022-09-19 MED ORDER — TRIAMCINOLONE ACETONIDE 10 MG/ML IJ SUSP
10.0000 mg | Freq: Once | INTRAMUSCULAR | Status: AC
  Administered 2022-09-19: 10 mg

## 2022-09-19 NOTE — Progress Notes (Signed)
Subjective:   Patient ID: Natasha Hale, female   DOB: 67 y.o.   MRN: [id number]   HPI Patient states she has had several weeks of pain in her left foot especially when she is barefoot and she does not remember injury.  It is between the third and fifth toes and she had a history of neuroma excision 24 years ago on the right.  Patient is pretty good in shoes but does poorly barefoot.  Patient does not smoke likes to be active   Review of Systems  All other systems reviewed and are negative.       Objective:  Physical Exam Vitals and nursing note reviewed.  Constitutional:      Appearance: She is well-developed.  Pulmonary:     Effort: Pulmonary effort is normal.  Musculoskeletal:        General: Normal range of motion.  Skin:    General: Skin is warm.  Neurological:     Mental Status: She is alert.     Neurovascular status intact muscle strength found to be adequate range of motion adequate with patient found to have mostly inflammation third interspace left with a popping sound that appears to create some discomfort into the digits with no pain around the metatarsal phalangeal joints currently     Assessment:  Most likely mild neuroma symptomatology left over capsulitis but cannot rule out     Plan:  H&P x-ray reviewed today I did sterile prep I injected the third interspace as a neuroma injection 3 mg Dexasone Kenalog 5 mg Xylocaine applied metatarsal pads take pressure off the joints instructed on more rigid bottom shoes and reappoint if symptoms were to persist or come back  X-rays are negative for signs of fracture or bony injury associated with this condition

## 2023-01-20 ENCOUNTER — Ambulatory Visit
Admission: EM | Admit: 2023-01-20 | Discharge: 2023-01-20 | Disposition: A | Discharge status: Home / Self Care | Payer: BC Managed Care – PPO | Attending: Internal Medicine | Admitting: Internal Medicine

## 2023-01-20 DIAGNOSIS — H1032 Unspecified acute conjunctivitis, left eye: Secondary | ICD-10-CM | POA: Diagnosis not present

## 2023-01-20 MED ORDER — POLYMYXIN B-TRIMETHOPRIM 10000-0.1 UNIT/ML-% OP SOLN: 1.0000 [drp] | OPHTHALMIC | 0 refills | Status: AC

## 2023-01-20 NOTE — ED Provider Notes (Signed)
[facility]    CSN: [id number] Arrival date & time: 01/20/23  1521      History   Chief Complaint No chief complaint on file.   HPI Natasha Hale is a 67 y.o. female presents for evaluation of eye redness.  Patient reports yesterday she developed left eye redness with purulent drainage and a "film" over her eye.  States her eye feels sore but denies any pain.  Intermittent blurry vision due to the drainage but otherwise denies any double vision.  Denies any injury to the eye.  She does not wear glasses or contacts.  She also endorses some viral upper respiratory symptoms including cough and congestion x 5 days.  No fevers.  She used a old prescription of her son's antibiotic eyedrops with improvement.  No other concerns at this time.  HPI  History reviewed. No pertinent past medical history.  Patient Active Problem List   Diagnosis Date Noted   Anxiety 06/02/2012   Insomnia 06/02/2012   Restless leg syndrome 06/02/2012    History reviewed. No pertinent surgical history.  OB History   No obstetric history on file.      Home Medications    Prior to Admission medications   Medication Sig Start Date End Date Taking? Authorizing Provider  trimethoprim-polymyxin b (POLYTRIM) ophthalmic solution Place 1 drop into the left eye every 4 (four) hours while awake for 7 days. 01/20/23 01/27/23 Yes Radford Pax, NP  clonazePAM (KLONOPIN) 0.5 MG tablet TAKE 1 TABLET BY MOUTH TWICE A DAY AS NEEDED FOR ANXIETY 04/13/21   Margaree Mackintosh, MD    Family History Family History  Problem Relation Age of Onset   Heart disease Father    Breast cancer Neg Hx     Social History Social History   Tobacco Use   Smoking status: Former    Types: Cigarettes   Smokeless tobacco: Former    Quit date: 06/02/1994  Substance Use Topics   Alcohol use: Yes   Drug use: No     Allergies   Patient has no known allergies.   Review of Systems Review of Systems  Eyes:  Positive for  discharge and redness.     Physical Exam Triage Vital Signs ED Triage Vitals  Encounter Vitals Group     BP 01/20/23 1531 (!) 159/92     Systolic BP Percentile --      Diastolic BP Percentile --      Pulse Rate 01/20/23 1531 83     Resp 01/20/23 1531 16     Temp 01/20/23 1531 98.1 F (36.7 C)     Temp Source 01/20/23 1531 Oral     SpO2 01/20/23 1531 95 %     Weight --      Height --      Head Circumference --      Peak Flow --      Pain Score 01/20/23 1534 1     Pain Loc --      Pain Education --      Exclude from Growth Chart --    No data found.  Updated Vital Signs BP (!) 159/92 (BP Location: Right Arm)   Pulse 83   Temp 98.1 F (36.7 C) (Oral)   Resp 16   SpO2 95%   Visual Acuity Right Eye Distance:   Left Eye Distance:   Bilateral Distance:    Right Eye Near:   Left Eye Near:    Bilateral Near:  Physical Exam Vitals and nursing note reviewed.  Constitutional:      General: She is not in acute distress.    Appearance: She is well-developed. She is not ill-appearing.  HENT:     Head: Normocephalic and atraumatic.     Right Ear: Tympanic membrane and ear canal normal.     Left Ear: Tympanic membrane and ear canal normal.     Nose: Congestion present.     Mouth/Throat:     Mouth: Mucous membranes are moist.     Pharynx: Oropharynx is clear. Uvula midline. No posterior oropharyngeal erythema.     Tonsils: No tonsillar exudate or tonsillar abscesses.  Eyes:     General: Lids are normal.     Conjunctiva/sclera:     Right eye: Right conjunctiva is not injected. No chemosis, exudate or hemorrhage.    Left eye: Left conjunctiva is injected. No chemosis, exudate or hemorrhage.    Pupils: Pupils are equal, round, and reactive to light.     Comments: Watery drainage noted  Cardiovascular:     Rate and Rhythm: Normal rate and regular rhythm.     Heart sounds: Normal heart sounds.  Pulmonary:     Effort: Pulmonary effort is normal.     Breath sounds:  Normal breath sounds.  Musculoskeletal:     Cervical back: Normal range of motion and neck supple.  Lymphadenopathy:     Cervical: No cervical adenopathy.  Skin:    General: Skin is warm and dry.  Neurological:     General: No focal deficit present.     Mental Status: She is alert and oriented to person, place, and time.  Psychiatric:        Mood and Affect: Mood normal.        Behavior: Behavior normal.      UC Treatments / Results  Labs (all labs ordered are listed, but only abnormal results are displayed) Labs Reviewed - No data to display  EKG   Radiology No results found.  Procedures Procedures (including critical care time)  Medications Ordered in UC Medications - No data to display  Initial Impression / Assessment and Plan / UC Course  I have reviewed the triage vital signs and the nursing notes.  Pertinent labs & imaging results that were available during my care of the patient were reviewed by me and considered in my medical decision making (see chart for details).     I reviewed exam and symptoms with patient.  No red flags.  Discussed viral versus bacterial conjunctivitis.  Patient reports purulent drainage that improved after she she used an expired antibiotic eyedrop.  Will do trial of Polytrim x 2 days.  Patient instructed if no improvement after 2 days to stop this and start over-the-counter antihistamine eyedrops such as Clear Eyes.  Patient verbalized understanding.  Continue warm compresses as needed.  PCP follow-up as symptoms do not improve.  ER precautions reviewed and patient verbalized understanding. Final Clinical Impressions(s) / UC Diagnoses   Final diagnoses:  Acute conjunctivitis of left eye, unspecified acute conjunctivitis type     Discharge Instructions      Start the antibiotic eyedrops as prescribed.  If you do not have any improvement after 2 days you can stop this and start over-the-counter antihistamine eyedrops such as Clear  Eyes.  Continue warm compresses to your eye as needed.  Follow-up with your PCP if your symptoms or not improving.  Please go to the emergency room if you develop any  worsening symptoms.  I hope you feel better soon!    ED Prescriptions     Medication Sig Dispense Auth. Provider   trimethoprim-polymyxin b (POLYTRIM) ophthalmic solution Place 1 drop into the left eye every 4 (four) hours while awake for 7 days. 10 mL Radford Pax, NP      PDMP not reviewed this encounter.   Radford Pax, NP 01/20/23 [phone]

## 2023-01-20 NOTE — ED Triage Notes (Addendum)
Pt presents to UC w/ c/o redness to left eye x3 days. Pt reports a lot of drainage from that eye. Pt reports headache, nasal congestion, and cough x5 days.

## 2023-01-20 NOTE — Discharge Instructions (Signed)
Start the antibiotic eyedrops as prescribed.  If you do not have any improvement after 2 days you can stop this and start over-the-counter antihistamine eyedrops such as Clear Eyes.  Continue warm compresses to your eye as needed.  Follow-up with your PCP if your symptoms or not improving.  Please go to the emergency room if you develop any worsening symptoms.  I hope you feel better soon!

## 2023-02-01 ENCOUNTER — Other Ambulatory Visit: Payer: Self-pay | Admitting: Internal Medicine

## 2023-02-01 DIAGNOSIS — Z1231 Encounter for screening mammogram for malignant neoplasm of breast: Secondary | ICD-10-CM

## 2023-02-28 ENCOUNTER — Ambulatory Visit
Admission: RE | Admit: 2023-02-28 | Discharge: 2023-02-28 | Disposition: A | Discharge status: Home / Self Care | Payer: BC Managed Care – PPO | Source: Ambulatory Visit | Attending: Internal Medicine | Admitting: Internal Medicine

## 2023-02-28 DIAGNOSIS — Z1231 Encounter for screening mammogram for malignant neoplasm of breast: Secondary | ICD-10-CM

## 2023-03-05 ENCOUNTER — Other Ambulatory Visit: Payer: Self-pay

## 2023-03-05 ENCOUNTER — Telehealth: Payer: Self-pay | Admitting: Internal Medicine

## 2023-03-05 DIAGNOSIS — Z780 Asymptomatic menopausal state: Secondary | ICD-10-CM

## 2023-03-05 DIAGNOSIS — Z13820 Encounter for screening for osteoporosis: Secondary | ICD-10-CM

## 2023-03-05 NOTE — Telephone Encounter (Signed)
Natasha Hale [phone]  [name] would like for you to put in her Bone Density in for [facility] on [address]. They can get it done this year where the place she has been going can not do it till some time next year.

## 2023-03-18 ENCOUNTER — Ambulatory Visit (HOSPITAL_BASED_OUTPATIENT_CLINIC_OR_DEPARTMENT_OTHER)
Admission: RE | Admit: 2023-03-18 | Discharge: 2023-03-18 | Disposition: A | Discharge status: Home / Self Care | Payer: BC Managed Care – PPO | Source: Ambulatory Visit | Attending: Internal Medicine | Admitting: Internal Medicine

## 2023-03-18 DIAGNOSIS — Z13820 Encounter for screening for osteoporosis: Secondary | ICD-10-CM | POA: Insufficient documentation

## 2023-03-18 DIAGNOSIS — M8580 Other specified disorders of bone density and structure, unspecified site: Secondary | ICD-10-CM | POA: Diagnosis present

## 2023-03-18 DIAGNOSIS — Z780 Asymptomatic menopausal state: Secondary | ICD-10-CM | POA: Insufficient documentation

## 2023-05-20 NOTE — Progress Notes (Shared)
    Patient Care Team: Margaree Mackintosh, MD as PCP - General (Internal Medicine)  Visit Date: 05/20/23  Subjective:    Patient ID: Natasha Hale , Female   DOB: 1956/04/24, 67 y.o.    MRN: [id number]   67 y.o. Female presents today for annual comprehensive physical exam.  History of low back pain and is seeing Dr. Ezzard Standing who has retired in the remote past.  Surgery was not advised.  History of L4-L5 spondylosis with lateral translation causing L4 nerve root pain.  Has been diagnosed with scoliosis of the lumbar spine and left sciatica.  History of anxiety treated with clonazepam 0.5 mg twice daily as needed.  No known drug allergies.   History of restless leg syndrome.  Iron level has been checked previously and was normal.  History of lymphocytic colitis.   History of Morton's neuroma removed from right foot by Dr. Pamelia Hoit in 1999.  Mammogram normal on 02/28/23.  Had colonoscopy by Dr. Loreta Ave in 2022 and return in 2027    Vaccine counseling: due for flu, tetanus vaccines. UTD on shingles vaccines.  No past medical history on file.   Family History  Problem Relation Age of Onset   Heart disease Father    Breast cancer Neg Hx     Social History   Social History Narrative   Not on file      ROS      Objective:   Vitals: There were no vitals taken for this visit.   Physical Exam    Results:   Studies obtained and personally reviewed by me:  Imaging, colonoscopy, mammogram, bone density scan, echocardiogram, heart cath, stress test, CT calcium score, etc. ***   Labs:       Component Value Date/Time   NA 140 05/18/2022 0911   K 4.8 05/18/2022 0911   CL 103 05/18/2022 0911   CO2 29 05/18/2022 0911   GLUCOSE 100 (H) 05/18/2022 0911   BUN 16 05/18/2022 0911   CREATININE 0.68 05/18/2022 0911   CALCIUM 9.7 05/18/2022 0911   PROT 7.2 05/18/2022 0911   ALBUMIN 4.7 04/12/2016 1146   AST 18 05/18/2022 0911   ALT 16 05/18/2022 0911   ALKPHOS 55 04/12/2016  1146   BILITOT 0.8 05/18/2022 0911   GFRNONAA 87 10/18/2020 0918   GFRAA 101 10/18/2020 0918     Lab Results  Component Value Date   WBC 5.3 05/18/2022   HGB 14.5 05/18/2022   HCT 41.8 05/18/2022   MCV 94.6 05/18/2022   PLT 235 05/18/2022    Lab Results  Component Value Date   CHOL 217 (H) 05/18/2022   HDL 83 05/18/2022   LDLCALC 113 (H) 05/18/2022   LDLDIRECT 111 04/12/2016   TRIG 106 05/18/2022   CHOLHDL 2.6 05/18/2022    No results found for: "HGBA1C"   Lab Results  Component Value Date   TSH 3.15 05/18/2022     No results found for: "PSA1", "PSA" *** delete for female pts  ***    Assessment & Plan:   ***    I,Alexander Ruley,acting as a scribe for Margaree Mackintosh, MD.,have documented all relevant documentation on the behalf of Margaree Mackintosh, MD,as directed by  Margaree Mackintosh, MD while in the presence of Margaree Mackintosh, MD.   ***

## 2023-05-24 ENCOUNTER — Ambulatory Visit: Payer: BC Managed Care – PPO | Admitting: Internal Medicine

## 2023-05-24 ENCOUNTER — Other Ambulatory Visit: Payer: BC Managed Care – PPO

## 2023-05-24 ENCOUNTER — Other Ambulatory Visit: Payer: Self-pay | Admitting: Internal Medicine

## 2023-05-24 ENCOUNTER — Encounter: Payer: Self-pay | Admitting: Internal Medicine

## 2023-05-24 VITALS — BP 130/80 | HR 70 | Temp 98.8°F | Ht 62.0 in | Wt 138.0 lb

## 2023-05-24 DIAGNOSIS — Z0000 Encounter for general adult medical examination without abnormal findings: Secondary | ICD-10-CM

## 2023-05-24 DIAGNOSIS — H00025 Hordeolum internum left lower eyelid: Secondary | ICD-10-CM | POA: Diagnosis not present

## 2023-05-24 DIAGNOSIS — E7800 Pure hypercholesterolemia, unspecified: Secondary | ICD-10-CM

## 2023-05-24 DIAGNOSIS — Z780 Asymptomatic menopausal state: Secondary | ICD-10-CM

## 2023-05-24 DIAGNOSIS — Z8249 Family history of ischemic heart disease and other diseases of the circulatory system: Secondary | ICD-10-CM

## 2023-05-24 DIAGNOSIS — F411 Generalized anxiety disorder: Secondary | ICD-10-CM

## 2023-05-24 MED ORDER — TOBREX 0.3 % OP OINT: 1.0000 | TOPICAL_OINTMENT | Freq: Three times a day (TID) | OPHTHALMIC | 0 refills | Status: DC

## 2023-05-24 MED ORDER — DOXYCYCLINE HYCLATE 100 MG PO TABS: 100.0000 mg | ORAL_TABLET | Freq: Two times a day (BID) | ORAL | 0 refills | Status: DC

## 2023-05-24 NOTE — Patient Instructions (Signed)
Apply tobramycin ophthalmic ointment to the left lower inner eyelid 3 times daily.  Apply warm compresses to left eye 20 minutes 2 or 3 times daily.  We will follow-up with you this coming Monday.  Finish oral antibiotics as prescribed by physician.

## 2023-05-24 NOTE — Progress Notes (Signed)
Patient Care Team: Margaree Mackintosh, MD as PCP - General (Internal Medicine)  Visit Date: 05/24/23  Subjective:    Patient ID: Natasha Hale , Female   DOB: Dec 10, 1955, 67 y.o.    MRN: [id number]   67 y.o. Female presents today for hordeolum left lower eyelid with some discharge. Recently seen by ophthalmologist.   Seen at urgent care on 01/20/23 for acute conjunctivitis left eye and treated with Polytrim.    Family History  Problem Relation Age of Onset   Heart disease Father    Breast cancer Neg Hx     Social Hx: Married. Practicing Attorney. Nonsmoker.     Review of Systems  Constitutional:  Negative for fever and malaise/fatigue.  HENT:  Negative for congestion.   Eyes:  Positive for pain (Stye left lower eyelid). Negative for blurred vision.  Respiratory:  Negative for cough and shortness of breath.   Cardiovascular:  Negative for chest pain, palpitations and leg swelling.  Gastrointestinal:  Negative for vomiting.  Musculoskeletal:  Negative for back pain.  Skin:  Negative for rash.  Neurological:  Negative for loss of consciousness and headaches.        Objective:   Vitals: BP 130/80 (BP Location: Left Arm, Patient Position: Sitting)   Pulse 70   Temp 98.8 F (37.1 C) (Tympanic)   Ht 5\' 2"  (1.575 m)   Wt 138 lb (62.6 kg)   SpO2 98%   BMI 25.24 kg/m    Physical Exam Vitals and nursing note reviewed.  Constitutional:      General: She is not in acute distress.    Appearance: Normal appearance. She is not toxic-appearing.  HENT:     Head: Normocephalic and atraumatic.  Eyes:     Extraocular Movements: Extraocular movements intact.     Pupils: Pupils are equal, round, and reactive to light.     Comments: Pustule left lower eyelid with surrounding erythema-- no discharge.  Pulmonary:     Effort: Pulmonary effort is normal.  Skin:    General: Skin is warm and dry.  Neurological:     Mental Status: She is alert and oriented to person, place, and  time. Mental status is at baseline.  Psychiatric:        Mood and Affect: Mood normal.        Behavior: Behavior normal.        Thought Content: Thought content normal.        Judgment: Judgment normal.      Results:   Studies obtained and personally reviewed by me:   Labs:       Component Value Date/Time   NA 140 05/18/2022 0911   K 4.8 05/18/2022 0911   CL 103 05/18/2022 0911   CO2 29 05/18/2022 0911   GLUCOSE 100 (H) 05/18/2022 0911   BUN 16 05/18/2022 0911   CREATININE 0.68 05/18/2022 0911   CALCIUM 9.7 05/18/2022 0911   PROT 7.2 05/18/2022 0911   ALBUMIN 4.7 04/12/2016 1146   AST 18 05/18/2022 0911   ALT 16 05/18/2022 0911   ALKPHOS 55 04/12/2016 1146   BILITOT 0.8 05/18/2022 0911   GFRNONAA 87 10/18/2020 0918   GFRAA 101 10/18/2020 0918     Lab Results  Component Value Date   WBC 5.3 05/18/2022   HGB 14.5 05/18/2022   HCT 41.8 05/18/2022   MCV 94.6 05/18/2022   PLT 235 05/18/2022    Lab Results  Component Value Date   CHOL 217 (H)  05/18/2022   HDL 83 05/18/2022   LDLCALC 113 (H) 05/18/2022   LDLDIRECT 111 04/12/2016   TRIG 106 05/18/2022   CHOLHDL 2.6 05/18/2022    No results found for: "HGBA1C"   Lab Results  Component Value Date   TSH 3.15 05/18/2022      Assessment & Plan:   Hordeolum left lower eyelid: prescribed Tobrex ophthalmic ointment 0.3% to use in left eye three times daily. Warm compresses topically 3 times daily.  Doxycycline 100 mg twice daily for 5 days. Will be coming in on Monday for CPE. Labs drawn for CPE today.    I,Alexander Ruley,acting as a Neurosurgeon for Margaree Mackintosh, MD.,have documented all relevant documentation on the behalf of Margaree Mackintosh, MD,as directed by  Margaree Mackintosh, MD while in the presence of Margaree Mackintosh, MD.   I, Margaree Mackintosh, MD, have reviewed all documentation for this visit. The documentation on 05/24/23 for the exam, diagnosis, procedures, and orders are all accurate and complete.

## 2023-05-27 ENCOUNTER — Encounter: Payer: BC Managed Care – PPO | Admitting: Internal Medicine

## 2023-05-27 ENCOUNTER — Ambulatory Visit (INDEPENDENT_AMBULATORY_CARE_PROVIDER_SITE_OTHER): Payer: BC Managed Care – PPO | Admitting: Internal Medicine

## 2023-05-27 VITALS — BP 130/78 | HR 72 | Ht 62.0 in | Wt 137.0 lb

## 2023-05-27 DIAGNOSIS — M545 Low back pain, unspecified: Secondary | ICD-10-CM

## 2023-05-27 DIAGNOSIS — H00025 Hordeolum internum left lower eyelid: Secondary | ICD-10-CM | POA: Diagnosis not present

## 2023-05-27 DIAGNOSIS — Z0000 Encounter for general adult medical examination without abnormal findings: Secondary | ICD-10-CM | POA: Diagnosis not present

## 2023-05-27 DIAGNOSIS — Z23 Encounter for immunization: Secondary | ICD-10-CM | POA: Diagnosis not present

## 2023-05-27 DIAGNOSIS — F5101 Primary insomnia: Secondary | ICD-10-CM

## 2023-05-27 DIAGNOSIS — E7800 Pure hypercholesterolemia, unspecified: Secondary | ICD-10-CM

## 2023-05-27 DIAGNOSIS — M8580 Other specified disorders of bone density and structure, unspecified site: Secondary | ICD-10-CM | POA: Diagnosis not present

## 2023-05-27 DIAGNOSIS — G8929 Other chronic pain: Secondary | ICD-10-CM

## 2023-05-27 DIAGNOSIS — F411 Generalized anxiety disorder: Secondary | ICD-10-CM | POA: Diagnosis not present

## 2023-05-27 DIAGNOSIS — K52832 Lymphocytic colitis: Secondary | ICD-10-CM

## 2023-05-27 DIAGNOSIS — E559 Vitamin D deficiency, unspecified: Secondary | ICD-10-CM

## 2023-05-27 DIAGNOSIS — Z8249 Family history of ischemic heart disease and other diseases of the circulatory system: Secondary | ICD-10-CM

## 2023-05-27 DIAGNOSIS — K5901 Slow transit constipation: Secondary | ICD-10-CM

## 2023-05-27 LAB — POCT URINALYSIS DIP (CLINITEK)
Bilirubin, UA: NEGATIVE
Blood, UA: NEGATIVE
Glucose, UA: NEGATIVE mg/dL
Ketones, POC UA: NEGATIVE mg/dL
Leukocytes, UA: NEGATIVE
Nitrite, UA: NEGATIVE
POC PROTEIN,UA: NEGATIVE
Spec Grav, UA: 1.01 (ref 1.010–1.025)
Urobilinogen, UA: 0.2 U/dL
pH, UA: 7 (ref 5.0–8.0)

## 2023-05-27 MED ORDER — CLONAZEPAM 0.5 MG PO TABS: ORAL_TABLET | ORAL | 5 refills | Status: DC

## 2023-05-27 NOTE — Progress Notes (Addendum)
Patient Care Team: Margaree Mackintosh, MD as PCP - General (Internal Medicine)  Visit Date: 05/27/23  Subjective:    Patient ID: Natasha Hale , Female   DOB: February 24, 1956, 67 y.o.    MRN: [id number]   67 y.o. Female presents today for annual comprehensive physical exam. History of anxiety.  History of anxiety treated with clonazepam 0.5 mg twice daily as needed.  History of elevated cholesterol currently diet and exercise controlled. CHOL elevated at 204, LDL elevated at 104.  She has been having some urinary incontinence. She has seen a urologist. Feels as though she has had the beginnings of a UTI on multiple occasions.  No known drug allergies.   History of restless leg syndrome.  Iron level has been checked previously and was normal.  History of lymphocytic colitis.   History of Morton's neuroma removed from right foot by Dr. Pamelia Hoit in 1999.  History of insomnia.  History of low back pain and is seeing Dr. Ezzard Standing who has retired in the remote past. Surgery was not advised. History of L4-L5 spondylosis with lateral translation causing L4 nerve root pain. Has been diagnosed with scoliosis of the lumbar spine and left sciatica.   History of lymphocytic colitis with first flare-up in 2015 with no blood, mucus obvious in stool. Occasionally has diarrhea. Random colonic biopsies in 2009 revealed lymphocytic colitis.  Mammogram normal on 02/28/23.   Had colonoscopy by Dr. Loreta Ave in 2022 and return in 2027. History of adenomatous polyps on 2019 colonoscopy.  03/18/23 bone density with T-score -2.1 at left femur neck.  05/24/23 labs reviewed today. Glucose elevated at 106. Kidney, liver functions normal. Electrolytes normal. Blood proteins normal. CBC normal. TSH normal.    Family History  Problem Relation Age of Onset   Heart disease Father    Breast cancer Neg Hx     Social Hx: Patient is an Pensions consultant. Married. One son adopted from [place] with cerebral palsy. Nonsmoker. Husband  is Twana First, also an attorney     Review of Systems  Constitutional:  Negative for chills, fever, malaise/fatigue and weight loss.  HENT:  Negative for hearing loss, sinus pain and sore throat.   Respiratory:  Negative for cough, hemoptysis and shortness of breath.   Cardiovascular:  Negative for chest pain, palpitations, leg swelling and PND.  Gastrointestinal:  Negative for abdominal pain, constipation, diarrhea, heartburn, nausea and vomiting.  Genitourinary:  Negative for dysuria, frequency and urgency.       (+) Urinary incontinence  Musculoskeletal:  Negative for back pain, myalgias and neck pain.  Skin:  Negative for itching and rash.  Neurological:  Negative for dizziness, tingling, seizures and headaches.  Endo/Heme/Allergies:  Negative for polydipsia.  Psychiatric/Behavioral:  Negative for depression. The patient is not nervous/anxious.         Objective:   Vitals: BP 130/78   Pulse 72   Ht 5\' 2"  (1.575 m)   Wt 137 lb (62.1 kg)   SpO2 97%   BMI 25.06 kg/m    Physical Exam Vitals and nursing note reviewed.  Constitutional:      General: She is not in acute distress.    Appearance: Normal appearance. She is not ill-appearing or toxic-appearing.  HENT:     Head: Normocephalic and atraumatic.     Right Ear: Hearing, tympanic membrane, ear canal and external ear normal.     Left Ear: Hearing, tympanic membrane, ear canal and external ear normal.     Mouth/Throat:  Pharynx: Oropharynx is clear.  Eyes:     Extraocular Movements: Extraocular movements intact.     Pupils: Pupils are equal, round, and reactive to light.     Comments: Hordeolum internum left lower eyelid healing well.  Neck:     Thyroid: No thyroid mass, thyromegaly or thyroid tenderness.     Vascular: No carotid bruit.  Cardiovascular:     Rate and Rhythm: Normal rate and regular rhythm. No extrasystoles are present.    Pulses:          Dorsalis pedis pulses are 2+ on the right side and  2+ on the left side.     Heart sounds: Normal heart sounds. No murmur heard.    No friction rub. No gallop.  Pulmonary:     Effort: Pulmonary effort is normal.     Breath sounds: Normal breath sounds. No decreased breath sounds, wheezing, rhonchi or rales.  Chest:     Chest wall: No mass.  Breasts:    Right: No mass.     Left: No mass.  Abdominal:     Palpations: Abdomen is soft. There is no hepatomegaly, splenomegaly or mass.     Tenderness: There is no abdominal tenderness.     Hernia: No hernia is present.  Genitourinary:    Comments: NFEG. Bimanual exam normal- rectovaginal confirms.  Stool guaiac negative. Musculoskeletal:     Cervical back: Normal range of motion.     Right lower leg: No edema.     Left lower leg: No edema.  Lymphadenopathy:     Cervical: No cervical adenopathy.     Upper Body:     Right upper body: No supraclavicular adenopathy.     Left upper body: No supraclavicular adenopathy.  Skin:    General: Skin is warm and dry.  Neurological:     General: No focal deficit present.     Mental Status: She is alert and oriented to person, place, and time. Mental status is at baseline.     Sensory: Sensation is intact.     Motor: Motor function is intact. No weakness.     Deep Tendon Reflexes: Reflexes are normal and symmetric.  Psychiatric:        Attention and Perception: Attention normal.        Mood and Affect: Mood normal.        Speech: Speech normal.        Behavior: Behavior normal.        Thought Content: Thought content normal.        Cognition and Memory: Cognition normal.        Judgment: Judgment normal.       Results:   Studies obtained and personally reviewed by me:  Mammogram normal on 02/28/23.   Had colonoscopy by Dr. Loreta Ave in 2022 and return in 2027.   03/18/23 bone density with T-score -2.1 at left femur neck.  Labs:       Component Value Date/Time   NA 138 05/24/2023 0915   K 4.6 05/24/2023 0915   CL 102 05/24/2023 0915    CO2 27 05/24/2023 0915   GLUCOSE 106 (H) 05/24/2023 0915   BUN 15 05/24/2023 0915   CREATININE 0.77 05/24/2023 0915   CALCIUM 9.9 05/24/2023 0915   PROT 7.1 05/24/2023 0915   ALBUMIN 4.7 04/12/2016 1146   AST 17 05/24/2023 0915   ALT 21 05/24/2023 0915   ALKPHOS 55 04/12/2016 1146   BILITOT 0.8 05/24/2023 0915   GFRNONAA 87  10/18/2020 0918   GFRAA 101 10/18/2020 0918     Lab Results  Component Value Date   WBC 5.3 05/24/2023   HGB 14.1 05/24/2023   HCT 43.5 05/24/2023   MCV 96.2 05/24/2023   PLT 274 05/24/2023    Lab Results  Component Value Date   CHOL 204 (H) 05/24/2023   HDL 80 05/24/2023   LDLCALC 104 (H) 05/24/2023   LDLDIRECT 111 04/12/2016   TRIG 108 05/24/2023   CHOLHDL 2.6 05/24/2023    No results found for: "HGBA1C"   Lab Results  Component Value Date   TSH 2.10 05/24/2023      Assessment & Plan:   Anxiety: stable with clonazepam 0.5 mg twice daily as needed. Refilled.  Hordeolum- improved  Pure hypercholesterolemia: currently diet and exercise controlled. CHOL elevated at 204, LDL elevated at 104.  Urinary incontinence: she has seen a urologist. Feels as though she has had the beginnings of a UTI on multiple occasions.  Lymphocytic colitis: seen by Dr. Loreta Ave and stable without medication.  Hx of osteopenia.  Low back pain- stable  Urinalysis normal today.  Mammogram normal on 02/28/23.   Had colonoscopy by Dr. Loreta Ave in 2022 and return in 2027.   Vitamin D deficiency- take 5000 units OTC daily  Vaccine counseling: administered flu booster today. She will return for pneumococcal 20 vaccine.  Return in 1 year or as needed.    I,Alexander Ruley,acting as a Neurosurgeon for Margaree Mackintosh, MD.,have documented all relevant documentation on the behalf of Margaree Mackintosh, MD,as directed by  Margaree Mackintosh, MD while in the presence of Margaree Mackintosh, MD.   I, Margaree Mackintosh, MD, have reviewed all documentation for this visit. The documentation on 06/07/23  for the exam, diagnosis, procedures, and orders are all accurate and complete.

## 2023-05-29 LAB — CBC WITH DIFFERENTIAL/PLATELET
Absolute Lymphocytes: 1516 {cells}/uL (ref 850–3900)
Absolute Monocytes: 355 {cells}/uL (ref 200–950)
Basophils Absolute: 42 {cells}/uL (ref 0–200)
Basophils Relative: 0.8 %
Eosinophils Absolute: 80 {cells}/uL (ref 15–500)
Eosinophils Relative: 1.5 %
HCT: 43.5 % (ref 35.0–45.0)
Hemoglobin: 14.1 g/dL (ref 11.7–15.5)
MCH: 31.2 pg (ref 27.0–33.0)
MCHC: 32.4 g/dL (ref 32.0–36.0)
MCV: 96.2 fL (ref 80.0–100.0)
MPV: 9.4 fL (ref 7.5–12.5)
Monocytes Relative: 6.7 %
Neutro Abs: 3307 {cells}/uL (ref 1500–7800)
Neutrophils Relative %: 62.4 %
Platelets: 274 10*3/uL (ref 140–400)
RBC: 4.52 10*6/uL (ref 3.80–5.10)
RDW: 12.5 % (ref 11.0–15.0)
Total Lymphocyte: 28.6 %
WBC: 5.3 10*3/uL (ref 3.8–10.8)

## 2023-05-29 LAB — TEST AUTHORIZATION

## 2023-05-29 LAB — TSH: TSH: 2.1 m[IU]/L (ref 0.40–4.50)

## 2023-05-29 LAB — COMPLETE METABOLIC PANEL WITH GFR
AG Ratio: 2 (calc) (ref 1.0–2.5)
ALT: 21 U/L (ref 6–29)
AST: 17 U/L (ref 10–35)
Albumin: 4.7 g/dL (ref 3.6–5.1)
Alkaline phosphatase (APISO): 68 U/L (ref 37–153)
BUN: 15 mg/dL (ref 7–25)
CO2: 27 mmol/L (ref 20–32)
Calcium: 9.9 mg/dL (ref 8.6–10.4)
Chloride: 102 mmol/L (ref 98–110)
Creat: 0.77 mg/dL (ref 0.50–1.05)
Globulin: 2.4 g/dL (ref 1.9–3.7)
Glucose, Bld: 106 mg/dL — ABNORMAL HIGH (ref 65–99)
Potassium: 4.6 mmol/L (ref 3.5–5.3)
Sodium: 138 mmol/L (ref 135–146)
Total Bilirubin: 0.8 mg/dL (ref 0.2–1.2)
Total Protein: 7.1 g/dL (ref 6.1–8.1)
eGFR: 83 mL/min/{1.73_m2} (ref >=60)

## 2023-05-29 LAB — LIPID PANEL
Cholesterol: 204 mg/dL — ABNORMAL HIGH (ref <200)
HDL: 80 mg/dL (ref >=50)
LDL Cholesterol (Calc): 104 mg/dL — ABNORMAL HIGH
Non-HDL Cholesterol (Calc): 124 mg/dL (ref <130)
Total CHOL/HDL Ratio: 2.6 (calc) (ref <5.0)
Triglycerides: 108 mg/dL (ref <150)

## 2023-05-29 LAB — VITAMIN D 25 HYDROXY (VIT D DEFICIENCY, FRACTURES): Vit D, 25-Hydroxy: 17 ng/mL — ABNORMAL LOW (ref 30–100)

## 2023-06-07 ENCOUNTER — Encounter: Payer: Self-pay | Admitting: Internal Medicine

## 2023-06-07 NOTE — Patient Instructions (Addendum)
It was a pleasure to see you today. Hordeolum improved. Vitamin D level is low. Please take 5000 units daily over the counter. Flu vaccine given. Return in one year or as needed.

## 2023-06-10 ENCOUNTER — Ambulatory Visit (INDEPENDENT_AMBULATORY_CARE_PROVIDER_SITE_OTHER): Payer: BC Managed Care – PPO

## 2023-06-10 VITALS — BP 130/80 | HR 70 | Ht 62.0 in | Wt 137.0 lb

## 2023-06-10 DIAGNOSIS — Z23 Encounter for immunization: Secondary | ICD-10-CM

## 2023-06-10 NOTE — Progress Notes (Signed)
Patient is here for A PNEUMOCOCCAL CONJUGATE-20 VACCINE. Patient tolerated well, left deltoid.

## 2023-07-26 DIAGNOSIS — Z860100 Personal history of colon polyps, unspecified: Secondary | ICD-10-CM | POA: Insufficient documentation

## 2023-07-26 DIAGNOSIS — K52832 Lymphocytic colitis: Secondary | ICD-10-CM | POA: Insufficient documentation

## 2023-07-26 DIAGNOSIS — K5904 Chronic idiopathic constipation: Secondary | ICD-10-CM | POA: Insufficient documentation

## 2023-07-26 DIAGNOSIS — R635 Abnormal weight gain: Secondary | ICD-10-CM | POA: Insufficient documentation

## 2023-07-26 DIAGNOSIS — Z1211 Encounter for screening for malignant neoplasm of colon: Secondary | ICD-10-CM | POA: Insufficient documentation

## 2023-08-08 ENCOUNTER — Other Ambulatory Visit: Payer: BC Managed Care – PPO

## 2024-02-11 ENCOUNTER — Other Ambulatory Visit: Payer: Self-pay | Admitting: Internal Medicine

## 2024-02-11 DIAGNOSIS — Z1231 Encounter for screening mammogram for malignant neoplasm of breast: Secondary | ICD-10-CM

## 2024-03-04 ENCOUNTER — Ambulatory Visit
Admission: RE | Admit: 2024-03-04 | Discharge: 2024-03-04 | Disposition: A | Discharge status: Home / Self Care | Payer: Self-pay | Source: Ambulatory Visit | Attending: Internal Medicine | Admitting: Internal Medicine

## 2024-03-04 DIAGNOSIS — Z1231 Encounter for screening mammogram for malignant neoplasm of breast: Secondary | ICD-10-CM

## 2024-04-28 ENCOUNTER — Ambulatory Visit
Admission: EM | Admit: 2024-04-28 | Discharge: 2024-04-28 | Disposition: A | Discharge status: Home / Self Care | Attending: Emergency Medicine | Admitting: Emergency Medicine

## 2024-04-28 DIAGNOSIS — H10829 Rosacea conjunctivitis, unspecified eye: Secondary | ICD-10-CM

## 2024-04-28 HISTORY — DX: Restless legs syndrome: G25.81

## 2024-04-28 MED ORDER — DOXYCYCLINE HYCLATE 100 MG PO CAPS: 100.0000 mg | ORAL_CAPSULE | Freq: Two times a day (BID) | ORAL | 2 refills | Status: DC

## 2024-04-28 MED ORDER — AZITHROMYCIN 1 % OP SOLN: OPHTHALMIC | 0 refills | Status: AC

## 2024-04-28 MED ORDER — TOBRAMYCIN 0.3 % OP SOLN: 1.0000 [drp] | OPHTHALMIC | 0 refills | Status: AC

## 2024-04-28 NOTE — Discharge Instructions (Addendum)
 The history that you provided to me today coupled with my quick physical examination of your eyelid is concerning that you may have a condition called rosacea blepharitis or ocular rosacea.  Here is some quick information that I was able to locate using an online medical reference called UpToDate:  ROSACEA: OCULAR MANIFESTATIONS   Ocular involvement of rosacea may occur in the presence or absence of cutaneous manifestations. Examples of features of ocular rosacea include foreign body sensations, blepharitis, lid margin telangiectasia, tear abnormalities, meibomian gland inflammation, frequent chalazion, conjunctivitis, and rarely, corneal ulcers and vascularization. Patients who present with signs or symptoms of ocular involvement should be referred to an ophthalmologist for further evaluation.   Lid scrubs and warm compresses may help improve meibomian gland function, and topical antibiotics (eg, topical erythromycin, azithromycin, metronidazole) and topical ivermectin may quell mild lid inflammation.  I renewed your prescription for doxycycline .  I would like for you to take 1 tablet twice daily for 5-days at a time to suppress inflammation and prevent secondary infection.  I also provided you with two prescriptions for antibiotic eyedrops, each of which will also reduce inflammation.    The first antibiotic is azithromycin eyedrops.  Unfortunately, it does not appear to be on your formulary, most likely due to is convenient twice daily dosing.    If the prescription for this azithromycin eyedrops is cost prohibitive, I sent a second antibiotic eyedrop called tobramycin  which is listed on your formulary.  Tobramycin  should be used every 4 hours while you are awake.  As we discussed, please continue applying warm compress for 5 to 10 minutes 4 times daily to reduce inflammation and promote circulation.  The lid scrubs mentioned above can be something as simple as baby shampoo and a clean cotton  ball.  Your ophthalmologist may also recommend a specialized cleanser.  If you do not see meaningful improvement of your symptoms based on these treatments, I strongly encourage you to consider following up with an ophthalmologist, be at the ophthalmologist you are currently seeing at [facility] or a new ophthalmologist elsewhere.  You for visiting [facility] today.

## 2024-04-28 NOTE — ED Provider Notes (Signed)
 [facility]    CSN: [id number] Arrival date & time: 04/28/24  1154    HISTORY   Chief Complaint  Patient presents with   Eye Drainage   HPI Natasha Hale is a pleasant, 68 y.o. female who presents to urgent care today. Patient states she has been dealing with left-sided pinkeye for the past year and a half.  Patient states she had a flareup in December 2024 and in August 2024.  Patient states for the past couple of days she has had a red spot on her left lower eyelid in the same place that it is occurred twice before.  Patient states she has also noticed that she has a little bit of drainage coming from the medial aspect of her left eye as well.  Patient denies pain, vision changes.  Patient states she regularly sees an optometrist at [facility] who routinely provides her with prescriptions for doxycycline  and a special cleanser to use on her eyelids.  Patient states she has not been consistent with using the cleanser and is currently out of doxycycline .  Patient states she has also seen her primary care provider for the symptoms, per EMR reviewed by me, her PCP has provided patient with prescriptions for doxycycline , tobramycin  ointment as well.  Upon further discussion, patient reports a history of rosacea, states she received laser treatment several years ago which seemed to help.  Patient also reports a history of dry eyes, currently uses Restasis for this.  The history is provided by the patient.   Past Medical History:  Diagnosis Date   Restless leg syndrome    Patient Active Problem List   Diagnosis Date Noted   Abnormal weight gain 07/26/2023   Chronic idiopathic constipation 07/26/2023   Colon cancer screening 07/26/2023   History of colonic polyps 07/26/2023   Lymphocytic colitis 07/26/2023   Acquired scoliosis 05/02/2018   Degeneration of lumbar intervertebral disc 05/02/2018   Spinal stenosis of lumbar region 05/02/2018   Backache 04/15/2018   Anxiety  06/02/2012   Insomnia 06/02/2012   Restless leg syndrome 06/02/2012   Past Surgical History:  Procedure Laterality Date   NO PAST SURGERIES     OB History   No obstetric history on file.    Home Medications    Prior to Admission medications   Medication Sig Start Date End Date Taking? Authorizing Provider  azithromycin (AZASITE) 1 % ophthalmic solution Place 1 drop into the left eye 2 (two) times daily for 2 days, THEN 1 drop daily for 5 days. 04/28/24 05/05/24 Yes Joesph Shaver Scales, PA-C  clonazePAM  (KLONOPIN ) 0.5 MG tablet TAKE 1 TABLET BY MOUTH TWICE A DAY AS NEEDED FOR ANXIETY Patient taking differently: daily as needed. TAKE 1 TABLET BY MOUTH TWICE A DAY AS NEEDED FOR ANXIETY 05/27/23  Yes Baxley, Ronal PARAS, MD  cycloSPORINE (RESTASIS) 0.05 % ophthalmic emulsion 1 drop 2 (two) times daily. 02/07/24  Yes [provider]  doxycycline  (VIBRAMYCIN ) 100 MG capsule Take 1 capsule (100 mg total) by mouth 2 (two) times daily. 04/28/24 07/27/24 Yes Joesph Shaver Scales, PA-C  tobramycin  (TOBREX ) 0.3 % ophthalmic solution Place 1 drop into the left eye every 4 (four) hours. 04/28/24  Yes Joesph Shaver Scales, PA-C    Family History Family History  Problem Relation Age of Onset   Other Mother        Cerebral hemmorage   Heart disease Father    Healthy Sister    Healthy Brother  Healthy Brother    Healthy Brother    Breast cancer Neg Hx    Social History Social History   Tobacco Use   Smoking status: Former    Types: Cigarettes    Passive exposure: Past   Smokeless tobacco: Former    Quit date: 06/02/1994  Vaping Use   Vaping status: Never Used  Substance Use Topics   Alcohol use: Yes    Comment: soc   Drug use: No   Allergies   Patient has no known allergies.  Review of Systems Review of Systems Pertinent findings revealed after performing a 14 point review of systems has been noted in the history of present illness.  Physical Exam Vital Signs BP  (!) 157/84 (BP Location: Right Arm)   Pulse 75   Temp 98.1 F (36.7 C) (Oral)   Resp 16   SpO2 98%   No data found.  Physical Exam Vitals and nursing note reviewed.  Constitutional:      General: She is awake. She is not in acute distress.    Appearance: Normal appearance. She is well-developed and well-groomed. She is not ill-appearing.  Eyes:     General: Lids are everted, no foreign bodies appreciated. Vision grossly intact. No allergic shiner, visual field deficit or scleral icterus.       Left eye: No foreign body or discharge.     Conjunctiva/sclera:     Left eye: Left conjunctiva is not injected. No chemosis, exudate or hemorrhage.     Comments: Internal examination of the left lower lid reveals area of erythema.  EMR review revealed a picture of her left lower eyelid included in documentation of her visit with her PCP on May 24, 2023.  Area of erythema is in the same location as the hordeolum, which at the time was described as painful, was seen.  Patient denies pain at this time.  Neurological:     Mental Status: She is alert.  Psychiatric:        Behavior: Behavior is cooperative.     Visual Acuity Right Eye Distance:   Left Eye Distance:   Bilateral Distance:    Right Eye Near:   Left Eye Near:    Bilateral Near:     UC Couse / Diagnostics / Procedures:     Radiology No results found.  Procedures Procedures (including critical care time) EKG  Pending results:  Labs Reviewed - No data to display  Medications Ordered in UC: Medications - No data to display  UC Diagnoses / Final Clinical Impressions(s)   I have reviewed the triage vital signs and the nursing notes.  Pertinent labs & imaging results that were available during my care of the patient were reviewed by me and considered in my medical decision making (see chart for details).    Final diagnoses:  Rosacea blepharoconjunctivitis   Given patient's history of rosacea, frequent  recurrences of hordeolum versus chalazion in same location as area of irritation at this time, recommend treatment for presumed rosacea blepharitis with doxycycline  to reduce inflammation and topical antibiotic to reduce inflammation as well.  Prescriptions for azithromycin and tobramycin  drops were sent to pharmacy, azithromycin is more conveniently dosed and likely safer for her to use while using Restasis however it does not appear that her insurance will pay for it.  Tobramycin  was sent to the pharmacy as a backup.  Patient was advised of this and verbalized understanding.  Conservative care recommended.  Ophthalmology follow-up recommended if no improvement of  symptoms or worsening symptoms.  Please see discharge instructions below for details of plan of care as provided to patient. ED Prescriptions     Medication Sig Dispense Auth. Provider   doxycycline  (VIBRAMYCIN ) 100 MG capsule Take 1 capsule (100 mg total) by mouth 2 (two) times daily. 60 capsule Joesph Shaver Scales, PA-C   tobramycin  (TOBREX ) 0.3 % ophthalmic solution Place 1 drop into the left eye every 4 (four) hours. 5 mL Joesph Shaver Scales, PA-C   azithromycin (AZASITE) 1 % ophthalmic solution Place 1 drop into the left eye 2 (two) times daily for 2 days, THEN 1 drop daily for 5 days. 2.5 mL Joesph Shaver Scales, PA-C      PDMP not reviewed this encounter.    Discharge Instructions      The history that you provided to me today coupled with my quick physical examination of your eyelid is concerning that you may have a condition called rosacea blepharitis or ocular rosacea.  Here is some quick information that I was able to locate using an online medical reference called UpToDate:  ROSACEA: OCULAR MANIFESTATIONS   Ocular involvement of rosacea may occur in the presence or absence of cutaneous manifestations. Examples of features of ocular rosacea include foreign body sensations, blepharitis, lid margin telangiectasia,  tear abnormalities, meibomian gland inflammation, frequent chalazion, conjunctivitis, and rarely, corneal ulcers and vascularization. Patients who present with signs or symptoms of ocular involvement should be referred to an ophthalmologist for further evaluation.   Lid scrubs and warm compresses may help improve meibomian gland function, and topical antibiotics (eg, topical erythromycin, azithromycin, metronidazole) and topical ivermectin may quell mild lid inflammation.  I renewed your prescription for doxycycline .  I would like for you to take 1 tablet twice daily for 5-days at a time to suppress inflammation and prevent secondary infection.  I also provided you with two prescriptions for antibiotic eyedrops, each of which will also reduce inflammation.    The first antibiotic is azithromycin eyedrops.  Unfortunately, it does not appear to be on your formulary, most likely due to is convenient twice daily dosing.    If the prescription for this azithromycin eyedrops is cost prohibitive, I sent a second antibiotic eyedrop called tobramycin  which is listed on your formulary.  Tobramycin  should be used every 4 hours while you are awake.  As we discussed, please continue applying warm compress for 5 to 10 minutes 4 times daily to reduce inflammation and promote circulation.  The lid scrubs mentioned above can be something as simple as baby shampoo and a clean cotton ball.  Your ophthalmologist may also recommend a specialized cleanser.  If you do not see meaningful improvement of your symptoms based on these treatments, I strongly encourage you to consider following up with an ophthalmologist, be at the ophthalmologist you are currently seeing at [facility] or a new ophthalmologist elsewhere.  You for visiting [facility] today.    Disposition Upon Discharge:  Condition: stable for discharge home  Patient presented with an acute illness with associated systemic symptoms and  significant discomfort requiring urgent management. In my opinion, this is a condition that a prudent lay person (someone who possesses an average knowledge of health and medicine) may potentially expect to result in complications if not addressed urgently such as respiratory distress, impairment of bodily function or dysfunction of bodily organs.   Routine symptom specific, illness specific and/or disease specific instructions were discussed with the patient and/or caregiver at length.  As such, the patient has been evaluated and assessed, work-up was performed and treatment was provided in alignment with urgent care protocols and evidence based medicine.  Patient/parent/caregiver has been advised that the patient may require follow up for further testing and treatment if the symptoms continue in spite of treatment, as clinically indicated and appropriate.  Patient/parent/caregiver has been advised to return to the [facility] or PCP if no better; to PCP or the Emergency Department if new signs and symptoms develop, or if the current signs or symptoms continue to change or worsen for further workup, evaluation and treatment as clinically indicated and appropriate  The patient will follow up with their current PCP if and as advised. If the patient does not currently have a PCP we will assist them in obtaining one.   The patient may need specialty follow up if the symptoms continue, in spite of conservative treatment and management, for further workup, evaluation, consultation and treatment as clinically indicated and appropriate.  Patient/parent/caregiver verbalized understanding and agreement of plan as discussed.  All questions were addressed during visit.  Please see discharge instructions below for further details of plan.  This office note has been dictated using Teaching laboratory technician.  Unfortunately, this method of dictation can sometimes lead to typographical or grammatical errors.  I  apologize for your inconvenience in advance if this occurs.  Please do not hesitate to reach out to me if clarification is needed.      Joesph Shaver Scales, PA-C 04/28/24 1323

## 2024-04-28 NOTE — ED Triage Notes (Addendum)
 Patient states about 1.5 years ago she had bilateral pink eye. She has had flare ups since then including December of 2024 and August of 2024. She states for a couple of days she has had a red spot on her left lower eyelid and she also has eye drainage.   Pt denies any eye pain or vision changes. She has shown me her previous Doxycyline bottles from her previous treatments. The only intervention she attempted was starting Doxycycline  100 mg 1 dose this morning.

## 2024-05-14 NOTE — Progress Notes (Signed)
 "  Welcome to Medicare   Patient Care Team: Perri Ronal PARAS, MD as PCP - General (Internal Medicine)  Visit Date: 05/28/2024   Chief Complaint  Patient presents with   Annual Exam   Medicare Wellness   Subjective:  Patient: Natasha Hale, Female DOB: 05/05/1956, 68 y.o. MRN: [id number] Vitals:   05/28/24 1004  BP: (!) 140/90   Natasha Hale is a 68 y.o. Female who presents today for her Welcome to Medicare visit. Patient has Anxiety; Insomnia; Restless leg syndrome; Abnormal weight gain; Acquired scoliosis; Backache; Chronic idiopathic constipation; Colon cancer screening; Degeneration of lumbar intervertebral disc; History of colonic polyps; Lymphocytic colitis; and Spinal stenosis of lumbar region on their problem list.   History of anxiety. She has been dealing with a lot of stress during the holidays and she lost her husband a few months ago and she has a disabled sun. Counseling has been suggested to her.      She has been having some lower left quadrant pain. She had her colonoscopy in 2022 which was normal. She is followed by Dr. Kristie, Gastroenterologist. Her urinalysis was normal.     History of elevated cholesterol currently diet and exercise controlled.  05/22/2024 Lipid panel normal .   She has been having some urinary incontinence. She has seen a urologist. Feels as though she has had the beginnings of a UTI on multiple occasions.   No known drug allergies.   History of restless leg syndrome.  Iron level has been checked previously and was normal.  History of lymphocytic colitis.   History of Morton's neuroma removed from right foot by Dr. Odean in 1999.   History of insomnia.   History of low back pain and has seen Dr. Alix, Neurosurgeon in 2019. Conservative therapy was provided with NSAIDS.  Surgery was not advised. History of L4-L5 spondylosis with lateral translation causing L4 nerve root pain. Has been diagnosed with scoliosis of the lumbar spine and  left sciatica.    History of lymphocytic colitis with first flare-up in 2015 with no blood, mucus obvious in stool. Occasionally has diarrhea. Random colonic biopsies in 2009 revealed lymphocytic colitis.    Labs 05/22/2024 Blood glucose 106, Otherwise.      03/06/2024 Mammogram No mammographic evidence of malignancy. Repeat in one year.   03/24/2021 Colonoscopy The entire examined colon appeared normal. The examined portion of the ileum appeared normal. No specimens collected. Repeat in 5 years.    Health Maintenance  Topic Date Due   DTaP/Tdap/Td (2 - Td or Tdap) 02/17/2023   Influenza Vaccine  01/10/2024   COVID-19 Vaccine (7 - 2025-26 season) 02/10/2024   Medicare Annual Wellness (AWV)  05/28/2025   Mammogram  03/04/2026   Colonoscopy  03/25/2031   Pneumococcal Vaccine: 50+ Years  Completed   Bone Density Scan  Completed   Zoster Vaccines- Shingrix  Completed   Meningococcal B Vaccine  Aged Out   Hepatitis C Screening  Discontinued     Review of Systems  Constitutional:  Negative for fever and malaise/fatigue.  HENT:  Negative for congestion.   Eyes:  Negative for blurred vision.  Respiratory:  Negative for cough and shortness of breath.   Cardiovascular:  Negative for chest pain, palpitations and leg swelling.  Gastrointestinal:  Negative for vomiting.  Musculoskeletal:  Negative for back pain.  Skin:  Negative for rash.  Neurological:  Negative for loss of consciousness and headaches.   Objective:  Vitals: body mass index is 25.51 kg/m.  Today's Vitals   05/28/24 1004  BP: (!) 140/90  Pulse: 90  SpO2: 98%  Weight: 135 lb (61.2 kg)  Height: 5' 1 (1.549 m)  PainSc: 0-No pain   Physical Exam Vitals and nursing note reviewed.  Constitutional:      General: She is not in acute distress.    Appearance: Normal appearance. She is not ill-appearing or toxic-appearing.  HENT:     Head: Normocephalic and atraumatic.     Right Ear: Hearing, tympanic membrane, ear  canal and external ear normal.     Left Ear: Hearing, tympanic membrane, ear canal and external ear normal.     Mouth/Throat:     Pharynx: Oropharynx is clear.  Eyes:     Extraocular Movements: Extraocular movements intact.     Pupils: Pupils are equal, round, and reactive to light.  Neck:     Thyroid: No thyroid mass, thyromegaly or thyroid tenderness.     Vascular: No carotid bruit.  Cardiovascular:     Rate and Rhythm: Normal rate and regular rhythm. No extrasystoles are present.    Pulses:          Dorsalis pedis pulses are 2+ on the right side and 2+ on the left side.     Heart sounds: Normal heart sounds. No murmur heard.    No friction rub. No gallop.  Pulmonary:     Effort: Pulmonary effort is normal.     Breath sounds: Normal breath sounds. No decreased breath sounds, wheezing, rhonchi or rales.  Chest:     Chest wall: No mass.  Abdominal:     Palpations: Abdomen is soft. There is no hepatomegaly, splenomegaly or mass.     Tenderness: There is no abdominal tenderness.     Hernia: No hernia is present.  Musculoskeletal:     Cervical back: Normal range of motion.     Right lower leg: No edema.     Left lower leg: No edema.  Lymphadenopathy:     Cervical: No cervical adenopathy.     Upper Body:     Right upper body: No supraclavicular adenopathy.     Left upper body: No supraclavicular adenopathy.  Skin:    General: Skin is warm and dry.  Neurological:     General: No focal deficit present.     Mental Status: She is alert and oriented to person, place, and time. Mental status is at baseline.     Sensory: Sensation is intact.     Motor: Motor function is intact. No weakness.     Deep Tendon Reflexes: Reflexes are normal and symmetric.  Psychiatric:        Attention and Perception: Attention normal.        Mood and Affect: Mood normal.        Speech: Speech normal.        Behavior: Behavior normal.        Thought Content: Thought content normal.        Cognition  and Memory: Cognition normal.        Judgment: Judgment normal.     Current Outpatient Medications  Medication Instructions   ALPRAZolam  (XANAX ) 0.5 mg, Oral, 2 times daily PRN   cycloSPORINE (RESTASIS) 0.05 % ophthalmic emulsion 1 drop, 2 times daily   doxycycline  (VIBRAMYCIN ) 100 mg, Oral, 2 times daily   sertraline  (ZOLOFT ) 50 mg, Oral, Daily   tobramycin  (TOBREX ) 0.3 % ophthalmic solution 1 drop, Left Eye, Every 4 hours   Past Medical History:  Diagnosis Date   Restless leg syndrome    Medical/Surgical History Narrative:  Allergic/Intolerant to: No Known Allergies  Past Surgical History:  Procedure Laterality Date   NO PAST SURGERIES     Family History  Problem Relation Age of Onset   Other Mother        Cerebral hemmorage   Heart disease Father    Healthy Sister    Healthy Brother    Healthy Brother    Healthy Brother    Breast cancer Neg Hx    Social history: She works as an pensions consultant. Her husband recently passed away. She has an adopted son from [place] who has cerebral palsy.  Most Recent Health Risks Assessment:   Most Recent Social Determinants of Health (Including Hx of Tobacco, Alcohol, and Drug Use) SDOH Screenings   Food Insecurity: No Food Insecurity (05/28/2024)  Housing: Low Risk (05/28/2024)  Transportation Needs: No Transportation Needs (05/28/2024)  Utilities: Not At Risk (05/28/2024)  Alcohol Screen: Low Risk (05/28/2024)  Depression (PHQ2-9): High Risk (05/28/2024)  Financial Resource Strain: Low Risk (05/28/2024)  Physical Activity: Patient Declined (05/28/2024)  Social Connections: Unknown (05/28/2024)  Stress: Stress Concern Present (05/28/2024)  Tobacco Use: Medium Risk (05/28/2024)  Health Literacy: Adequate Health Literacy (05/28/2024)   Social History   Tobacco Use   Smoking status: Former    Types: Cigarettes    Passive exposure: Past   Smokeless tobacco: Former    Quit date: 06/02/1994  Vaping Use   Vaping status: Never Used   Substance Use Topics   Alcohol use: Yes    Comment: soc   Drug use: No     Most Recent Fall Risk Assessment:    05/28/2024   11:37 AM  Fall Risk   Falls in the past year? 0  Number falls in past yr: 0  Injury with Fall? 0  Risk for fall due to : No Fall Risks  Follow up Falls prevention discussed;Education provided;Falls evaluation completed   Most Recent Anxiety/Depression Screenings:    05/28/2024   12:00 PM 05/27/2023    8:36 AM  PHQ 2/9 Scores  PHQ - 2 Score 5 6  PHQ- 9 Score 17 9      Data saved with a previous flowsheet row definition      Results:  Studies Obtained And Personally Reviewed By Me:  03/06/2024 Mammogram No mammographic evidence of malignancy. Repeat in one year.   03/24/2021 Colonoscopy The entire examined colon appeared normal. The examined portion of the ileum appeared normal. No specimens collected. Repeat in 5 years.   Labs:  CBC w/ Differential Lab Results  Component Value Date   WBC 5.9 05/22/2024   RBC 4.41 05/22/2024   HGB 14.0 05/22/2024   HCT 41.9 05/22/2024   PLT 234 05/22/2024   MCV 95.0 05/22/2024   MCH 31.7 05/22/2024   MCHC 33.4 05/22/2024   RDW 12.5 05/22/2024   MPV 9.9 05/22/2024   LYMPHSABS 1,887 05/18/2022   MONOABS 376 04/12/2016   BASOSABS 77 05/22/2024    Comprehensive Metabolic Panel Lab Results  Component Value Date   NA 138 05/22/2024   K 4.4 05/22/2024   CL 102 05/22/2024   CO2 28 05/22/2024   GLUCOSE 106 (H) 05/22/2024   BUN 14 05/22/2024   CREATININE 0.69 05/22/2024   CALCIUM  9.3 05/22/2024   PROT 6.9 05/22/2024   ALBUMIN 4.7 04/12/2016   AST 19 05/22/2024   ALT 18 05/22/2024   ALKPHOS 55 04/12/2016   BILITOT 1.0 05/22/2024  EGFR 93 05/22/2024   GFRNONAA 87 10/18/2020   Lipid Panel  Lab Results  Component Value Date   CHOL 188 05/22/2024   HDL 93 05/22/2024   LDLCALC 79 05/22/2024   TRIG 80 05/22/2024   A1c Lab Results  Component Value Date   HGBA1C 5.5 05/22/2024    TSH Lab  Results  Component Value Date   TSH 2.10 05/22/2024    Assessment & Plan:   Orders Placed This Encounter  Procedures   CT ABDOMEN PELVIS W WO CONTRAST    If indicated for the ordered procedure, I authorize the administration of contrast media per Radiology protocol:   Yes    Does the patient have a contrast media/X-ray dye allergy?:   No    Preferred imaging location?:   [facility]    If indicated for the ordered procedure, I authorize the administration of oral contrast media per Radiology protocol:   Yes   CA 125   POCT URINALYSIS DIP (CLINITEK)   EKG 12-Lead   Meds ordered this encounter  Medications   sertraline  (ZOLOFT ) 50 MG tablet    Sig: Take 1 tablet (50 mg total) by mouth daily.    Dispense:  90 tablet    Refill:  1   ALPRAZolam  (XANAX ) 0.5 MG tablet    Sig: Take 1 tablet (0.5 mg total) by mouth 2 (two) times daily as needed for anxiety.    Dispense:  20 tablet    Refill:  0   Anxiety: She has been dealing with a lot of stress during the holidays and she lost her husband a few months ago and she has a disabled sun. Counseling has been suggested to her.      Zoloft  50 mg daily and Xanax  0.5 mg twice daily as needed prescribed.   Grief due to loss of husband recently- may benefit from counseling  Lower left quadrant pain: She has been having some lower left quadrant pain. She had her colonoscopy in 2022 which was normal. She is followed by Dr. Kristie, Gastroenterologist. Her urinalysis was normal.     CT Abdomen and pelvis ordered.    CA 125 collected today.    Elevated cholesterol: currently diet and exercise controlled.  05/22/2024 Lipid panel normal .   History of restless leg syndrome.  Iron level has been checked previously and was normal.    History of lymphocytic colitis.Patient would like CT of abdomen and pelvis with contrast. CA 125 normal. If we cannot get study approved, she will need to see Dr. Renaye Kristie.    low back pain: Saw Dr. Alix  in 2019. Surgery was not advised. History of L4-L5 spondylosis with lateral translation causing L4 nerve root pain. Has been diagnosed with scoliosis of the lumbar spine and left sciatica. At that time conservative therapy was recommended.     03/06/2024 Mammogram No mammographic evidence of malignancy. Repeat in one year.   03/24/2021 Colonoscopy The entire examined colon appeared normal. The examined portion of the ileum appeared normal. No specimens collected. Repeat in 5 years.     Return in 1 year (on 05/28/2025).   Welcome to Medicare done today including the all of the following: Reviewed patient's Family Medical History Reviewed patient's SDOH and reviewed tobacco, alcohol, and drug use.  Reviewed and updated list of patient's medical providers Assessment of cognitive impairment was done Assessed patient's functional ability Established a written schedule for health screening services Health Risk Assessent Completed and Reviewed  Discussed  health benefits of physical activity, and encouraged her to engage in regular exercise appropriate for her age and condition.    I,Makayla C Reid,acting as a scribe for Ronal [name], MD.,have documented all relevant documentation on the behalf of Ronal [name], MD,as directed by  Ronal [name], MD while in the presence of Ronal [name], MD.  I, Ronal [name], MD, have reviewed all documentation for and agree with the above Annual Wellness Visit documentation.  Ronal [name], MD Internal Medicine 05/28/2024 "

## 2024-05-22 ENCOUNTER — Other Ambulatory Visit: Payer: Medicare Other

## 2024-05-22 DIAGNOSIS — E7800 Pure hypercholesterolemia, unspecified: Secondary | ICD-10-CM

## 2024-05-22 DIAGNOSIS — Z0000 Encounter for general adult medical examination without abnormal findings: Secondary | ICD-10-CM

## 2024-05-22 DIAGNOSIS — Z1329 Encounter for screening for other suspected endocrine disorder: Secondary | ICD-10-CM

## 2024-05-22 DIAGNOSIS — R7309 Other abnormal glucose: Secondary | ICD-10-CM

## 2024-05-22 DIAGNOSIS — M8580 Other specified disorders of bone density and structure, unspecified site: Secondary | ICD-10-CM

## 2024-05-23 LAB — COMPREHENSIVE METABOLIC PANEL WITH GFR
AG Ratio: 2 (calc) (ref 1.0–2.5)
ALT: 18 U/L (ref 6–29)
AST: 19 U/L (ref 10–35)
Albumin: 4.6 g/dL (ref 3.6–5.1)
Alkaline phosphatase (APISO): 56 U/L (ref 37–153)
BUN: 14 mg/dL (ref 7–25)
CO2: 28 mmol/L (ref 20–32)
Calcium: 9.3 mg/dL (ref 8.6–10.4)
Chloride: 102 mmol/L (ref 98–110)
Creat: 0.69 mg/dL (ref 0.60–1.00)
Globulin: 2.3 g/dL (ref 1.9–3.7)
Glucose, Bld: 106 mg/dL — ABNORMAL HIGH (ref 65–99)
Potassium: 4.4 mmol/L (ref 3.5–5.3)
Sodium: 138 mmol/L (ref 135–146)
Total Bilirubin: 1 mg/dL (ref 0.2–1.2)
Total Protein: 6.9 g/dL (ref 6.1–8.1)
eGFR: 93 mL/min/1.73m2 (ref >=60)

## 2024-05-23 LAB — LIPID PANEL
Cholesterol: 188 mg/dL (ref <200)
HDL: 93 mg/dL (ref >=50)
LDL Cholesterol (Calc): 79 mg/dL
Non-HDL Cholesterol (Calc): 95 mg/dL (ref <130)
Total CHOL/HDL Ratio: 2 (calc) (ref <5.0)
Triglycerides: 80 mg/dL (ref <150)

## 2024-05-23 LAB — CBC WITH DIFFERENTIAL/PLATELET
Absolute Lymphocytes: 2395 {cells}/uL (ref 850–3900)
Absolute Monocytes: 448 {cells}/uL (ref 200–950)
Basophils Absolute: 77 {cells}/uL (ref 0–200)
Basophils Relative: 1.3 %
Eosinophils Absolute: 130 {cells}/uL (ref 15–500)
Eosinophils Relative: 2.2 %
HCT: 41.9 % (ref 35.9–46.0)
Hemoglobin: 14 g/dL (ref 11.7–15.5)
MCH: 31.7 pg (ref 27.0–33.0)
MCHC: 33.4 g/dL (ref 31.6–35.4)
MCV: 95 fL (ref 81.4–101.7)
MPV: 9.9 fL (ref 7.5–12.5)
Monocytes Relative: 7.6 %
Neutro Abs: 2850 {cells}/uL (ref 1500–7800)
Neutrophils Relative %: 48.3 %
Platelets: 234 Thousand/uL (ref 140–400)
RBC: 4.41 Million/uL (ref 3.80–5.10)
RDW: 12.5 % (ref 11.0–15.0)
Total Lymphocyte: 40.6 %
WBC: 5.9 Thousand/uL (ref 3.8–10.8)

## 2024-05-23 LAB — HEMOGLOBIN A1C
Hgb A1c MFr Bld: 5.5 % (ref <5.7)
Mean Plasma Glucose: 111 mg/dL
eAG (mmol/L): 6.2 mmol/L

## 2024-05-23 LAB — TSH: TSH: 2.1 m[IU]/L (ref 0.40–4.50)

## 2024-05-28 ENCOUNTER — Encounter: Payer: Self-pay | Admitting: Internal Medicine

## 2024-05-28 ENCOUNTER — Ambulatory Visit: Payer: Medicare Other | Admitting: Internal Medicine

## 2024-05-28 VITALS — BP 140/90 | HR 90 | Ht 61.0 in | Wt 135.0 lb

## 2024-05-28 DIAGNOSIS — M545 Low back pain, unspecified: Secondary | ICD-10-CM

## 2024-05-28 DIAGNOSIS — F419 Anxiety disorder, unspecified: Secondary | ICD-10-CM

## 2024-05-28 DIAGNOSIS — R1032 Left lower quadrant pain: Secondary | ICD-10-CM | POA: Diagnosis not present

## 2024-05-28 DIAGNOSIS — Z0000 Encounter for general adult medical examination without abnormal findings: Secondary | ICD-10-CM

## 2024-05-28 DIAGNOSIS — G8929 Other chronic pain: Secondary | ICD-10-CM

## 2024-05-28 DIAGNOSIS — F4321 Adjustment disorder with depressed mood: Secondary | ICD-10-CM | POA: Diagnosis not present

## 2024-05-28 LAB — POCT URINALYSIS DIP (CLINITEK)
Bilirubin, UA: NEGATIVE
Blood, UA: NEGATIVE
Glucose, UA: NEGATIVE mg/dL
Ketones, POC UA: NEGATIVE mg/dL
Leukocytes, UA: NEGATIVE
Nitrite, UA: NEGATIVE
POC PROTEIN,UA: NEGATIVE
Spec Grav, UA: 1.01 (ref 1.010–1.025)
Urobilinogen, UA: 0.2 U/dL
pH, UA: 6.5 (ref 5.0–8.0)

## 2024-05-28 MED ORDER — SERTRALINE HCL 50 MG PO TABS: 50.0000 mg | ORAL_TABLET | Freq: Every day | ORAL | 1 refills | Status: AC

## 2024-05-28 MED ORDER — ALPRAZOLAM 0.5 MG PO TABS: 0.5000 mg | ORAL_TABLET | Freq: Two times a day (BID) | ORAL | 0 refills | Status: AC | PRN

## 2024-05-28 NOTE — Patient Instructions (Addendum)
 Natasha Hale,  CT has been ordered as requested. CA 125 is normal.  Take Xanax  and Zoloft  as needed for grief and anxiety.  Consider counseling.  Thank you for taking the time for your Medicare Wellness Visit. I appreciate your continued commitment to your health goals. Please review the care plan we discussed, and feel free to reach out if I can assist you further.  Please note that Annual Wellness Visits do not include a physical exam. Some assessments may be limited, especially if the visit was conducted virtually. If needed, we may recommend an in-person follow-up with your provider.  Ongoing Care Seeing your primary care provider every 3 to 6 months helps us  monitor your health and provide consistent, personalized care.   Referrals If a referral was made during today's visit and you haven't received any updates within two weeks, please contact the referred provider directly to check on the status.  Recommended Screenings:  Health Maintenance  Topic Date Due   Medicare Annual Wellness Visit  Never done   DTaP/Tdap/Td vaccine (2 - Td or Tdap) 02/17/2023   Flu Shot  01/10/2024   COVID-19 Vaccine (7 - 2025-26 season) 02/10/2024   Breast Cancer Screening  03/04/2026   Colon Cancer Screening  03/25/2031   Pneumococcal Vaccine for age over 33  Completed   Osteoporosis screening with Bone Density Scan  Completed   Zoster (Shingles) Vaccine  Completed   Meningitis B Vaccine  Aged Out   Hepatitis C Screening  Discontinued       05/28/2024   11:37 AM  Advanced Directives  Does Patient Have a Medical Advance Directive? Yes  Type of Advance Directive Living will;Healthcare Power of Attorney  Does patient want to make changes to medical advance directive? No - Patient declined  Copy of Healthcare Power of Attorney in Chart? No - copy requested    Vision: Annual vision screenings are recommended for early detection of glaucoma, cataracts, and diabetic retinopathy. These exams can also  reveal signs of chronic conditions such as diabetes and high blood pressure.  Dental: Annual dental screenings help detect early signs of oral cancer, gum disease, and other conditions linked to overall health, including heart disease and diabetes.  Please see the attached documents for additional preventive care recommendations.

## 2024-05-28 NOTE — Progress Notes (Signed)
 "  Chief Complaint  Patient presents with   Annual Exam   Medicare Wellness     Subjective:   Natasha Hale is a 68 y.o. female who presents for a Welcome to Medicare Exam.   Visit info / Clinical Intake: Medicare Wellness Visit Type:: Welcome to [employer] (IPPE) Persons participating in visit and providing information:: patient Medicare Wellness Visit Mode:: In-person (required for WTM) Interpreter Needed?: No Pre-visit prep was completed: yes AWV questionnaire completed by patient prior to visit?: no Living arrangements:: with family/others Patient's Overall Health Status Rating: (!) fair Typical amount of pain: some Does pain affect daily life?: (!) yes Are you currently prescribed opioids?: no  Dietary Habits and Nutritional Risks How many meals a day?: 3 Eats fruit and vegetables daily?: yes Most meals are obtained by: preparing own meals In the last 2 weeks, have you had any of the following?: none Diabetic:: no  Functional Status Activities of Daily Living (to include ambulation/medication): Independent Ambulation: Independent Medication Administration: Independent Home Management (perform basic housework or laundry): Independent Manage your own finances?: yes Primary transportation is: driving Concerns about vision?: no *vision screening is required for WTM* Concerns about hearing?: no  Fall Screening Falls in the past year?: 0 Number of falls in past year: 0 Was there an injury with Fall?: 0 Fall Risk Category Calculator: 0 Patient Fall Risk Level: Low Fall Risk  Fall Risk Patient at Risk for Falls Due to: No Fall Risks Fall risk Follow up: Falls prevention discussed; Education provided; Falls evaluation completed  Home and Transportation Safety: All rugs have non-skid backing?: (!) no All stairs or steps have railings?: yes Grab bars in the bathtub or shower?: (!) no Have non-skid surface in bathtub or shower?: (!) no Good home lighting?:  yes Regular seat belt use?: yes Hospital stays in the last year:: no  Cognitive Assessment Difficulty concentrating, remembering, or making decisions? : no Will 6CIT or Mini Cog be Completed: no 6CIT or Mini Cog Declined: patient alert, oriented, able to answer questions appropriately and recall recent events  Advance Directives (For Healthcare) Does Patient Have a Medical Advance Directive?: Yes Does patient want to make changes to medical advance directive?: No - Patient declined Type of Advance Directive: Living will; Healthcare Power of Attorney Copy of Healthcare Power of Attorney in Chart?: No - copy requested Copy of Living Will in Chart?: No - copy requested  Reviewed/Updated  Reviewed/Updated: Reviewed All (Medical, Surgical, Family, Medications, Allergies, Care Teams, Patient Goals)    Allergies (verified) Patient has no known allergies.   Current Medications (verified) Outpatient Encounter Medications as of 05/28/2024  Medication Sig   ALPRAZolam  (XANAX ) 0.5 MG tablet Take 1 tablet (0.5 mg total) by mouth 2 (two) times daily as needed for anxiety.   cycloSPORINE (RESTASIS) 0.05 % ophthalmic emulsion 1 drop 2 (two) times daily.   doxycycline  (VIBRAMYCIN ) 100 MG capsule Take 1 capsule (100 mg total) by mouth 2 (two) times daily.   sertraline  (ZOLOFT ) 50 MG tablet Take 1 tablet (50 mg total) by mouth daily.   tobramycin  (TOBREX ) 0.3 % ophthalmic solution Place 1 drop into the left eye every 4 (four) hours.   [DISCONTINUED] clonazePAM  (KLONOPIN ) 0.5 MG tablet TAKE 1 TABLET BY MOUTH TWICE A DAY AS NEEDED FOR ANXIETY (Patient taking differently: daily as needed. TAKE 1 TABLET BY MOUTH TWICE A DAY AS NEEDED FOR ANXIETY)   No facility-administered encounter medications on file as of 05/28/2024.    History: Past Medical History:  Diagnosis Date   Restless leg syndrome    Past Surgical History:  Procedure Laterality Date   NO PAST SURGERIES     Family History  Problem  Relation Age of Onset   Other Mother        Cerebral hemmorage   Heart disease Father    Healthy Sister    Healthy Brother    Healthy Brother    Healthy Brother    Breast cancer Neg Hx    Social History   Occupational History   Not on file  Tobacco Use   Smoking status: Former    Types: Cigarettes    Passive exposure: Past   Smokeless tobacco: Former    Quit date: 06/02/1994  Vaping Use   Vaping status: Never Used  Substance and Sexual Activity   Alcohol use: Yes    Comment: soc   Drug use: No   Sexual activity: Not on file   Tobacco Counseling Counseling given: Not Answered  SDOH Screenings   Food Insecurity: No Food Insecurity (05/28/2024)  Housing: Low Risk (05/28/2024)  Transportation Needs: No Transportation Needs (05/28/2024)  Utilities: Not At Risk (05/28/2024)  Alcohol Screen: Low Risk (05/28/2024)  Depression (PHQ2-9): High Risk (05/28/2024)  Financial Resource Strain: Low Risk (05/28/2024)  Physical Activity: Patient Declined (05/28/2024)  Social Connections: Unknown (05/28/2024)  Stress: Stress Concern Present (05/28/2024)  Tobacco Use: Medium Risk (05/28/2024)  Health Literacy: Adequate Health Literacy (05/28/2024)   See flowsheets for full screening details  Depression Screen PHQ 2 & 9 Depression Scale- Over the past 2 weeks, how often have you been bothered by any of the following problems? Little interest or pleasure in doing things: 2 Feeling down, depressed, or hopeless (PHQ Adolescent also includes...irritable): 3 PHQ-2 Total Score: 5 Trouble falling or staying asleep, or sleeping too much: 3 Feeling tired or having little energy: 2 Poor appetite or overeating (PHQ Adolescent also includes...weight loss): 1 Feeling bad about yourself - or that you are a failure or have let yourself or your family down: 3 Trouble concentrating on things, such as reading the newspaper or watching television (PHQ Adolescent also includes...like school work):  1 Moving or speaking so slowly that other people could have noticed. Or the opposite - being so fidgety or restless that you have been moving around a lot more than usual: 1 Thoughts that you would be better off dead, or of hurting yourself in some way: 1 PHQ-9 Total Score: 17 If you checked off any problems, how difficult have these problems made it for you to do your work, take care of things at home, or get along with other people?: Somewhat difficult      Goals Addressed   None          Objective:    Today's Vitals   05/28/24 1004  BP: (!) 140/90  Pulse: 90  SpO2: 98%  Weight: 135 lb (61.2 kg)  Height: 5' 1 (1.549 m)  PainSc: 0-No pain   Body mass index is 25.51 kg/m.   Physical Exam   Hearing/Vision screen No results found. Immunizations and Health Maintenance Health Maintenance  Topic Date Due   DTaP/Tdap/Td (2 - Td or Tdap) 02/17/2023   Influenza Vaccine  01/10/2024   COVID-19 Vaccine (7 - 2025-26 season) 02/10/2024   Medicare Annual Wellness (AWV)  05/28/2025   Mammogram  03/04/2026   Colonoscopy  03/25/2031   Pneumococcal Vaccine: 50+ Years  Completed   Bone Density Scan  Completed   Zoster Vaccines- Shingrix  Completed   Meningococcal B Vaccine  Aged Out   Hepatitis C Screening  Discontinued    EKG: normal EKG, normal sinus rhythm, unchanged from previous tracings     Assessment/Plan:  This is a routine wellness examination for Lira.  Patient Care Team: Perri Ronal PARAS, MD as PCP - General (Internal Medicine)  I have personally reviewed and noted the following in the patients chart:   Medical and social history Use of alcohol, tobacco or illicit drugs  Current medications and supplements including opioid prescriptions. Functional ability and status Nutritional status Physical activity Advanced directives List of other physicians Hospitalizations, surgeries, and ER visits in previous 12 months Vitals Screenings to include cognitive,  depression, and falls Referrals and appointments  Orders Placed This Encounter  Procedures   CT ABDOMEN PELVIS W WO CONTRAST    If indicated for the ordered procedure, I authorize the administration of contrast media per Radiology protocol:   Yes    Does the patient have a contrast media/X-ray dye allergy?:   No    Preferred imaging location?:   [facility]    If indicated for the ordered procedure, I authorize the administration of oral contrast media per Radiology protocol:   Yes   CA 125   POCT URINALYSIS DIP (CLINITEK)   EKG 12-Lead   In addition, I have reviewed and discussed with patient certain preventive protocols, quality metrics, and best practice recommendations. A written personalized care plan for preventive services as well as general preventive health recommendations were provided to patient.   Araceli Zelda, CMA   05/28/2024   Return in 1 year (on 05/28/2025).   IRonal PARAS Perri, MD, have reviewed all documentation for this visit. The documentation on 05/28/2024 for the exam, diagnosis, procedures, and orders are all accurate and complete.  "

## 2024-05-28 NOTE — Progress Notes (Deleted)
 Chief Complaint  Patient presents with   Annual Exam   Medicare Wellness     Subjective:   Natasha Hale is a 68 y.o. female who presents for a Welcome to Medicare Exam.   Visit info / Clinical Intake: Medicare Wellness Visit Type:: Welcome to [employer] (IPPE) Persons participating in visit and providing information:: patient Medicare Wellness Visit Mode:: In-person (required for WTM) Interpreter Needed?: No Pre-visit prep was completed: yes AWV questionnaire completed by patient prior to visit?: no Living arrangements:: with family/others Patient's Overall Health Status Rating: (!) fair Typical amount of pain: some Does pain affect daily life?: (!) yes Are you currently prescribed opioids?: no  Dietary Habits and Nutritional Risks How many meals a day?: 3 Eats fruit and vegetables daily?: yes Most meals are obtained by: preparing own meals In the last 2 weeks, have you had any of the following?: none Diabetic:: no  Functional Status Activities of Daily Living (to include ambulation/medication): Independent Ambulation: Independent Medication Administration: Independent Home Management (perform basic housework or laundry): Independent Manage your own finances?: yes Primary transportation is: driving Concerns about vision?: no *vision screening is required for WTM* Concerns about hearing?: no  Fall Screening Falls in the past year?: 0 Number of falls in past year: 0 Was there an injury with Fall?: 0 Fall Risk Category Calculator: 0 Patient Fall Risk Level: Low Fall Risk  Fall Risk Patient at Risk for Falls Due to: No Fall Risks Fall risk Follow up: Falls prevention discussed; Education provided; Falls evaluation completed  Home and Transportation Safety: All rugs have non-skid backing?: (!) no All stairs or steps have railings?: yes Grab bars in the bathtub or shower?: (!) no Have non-skid surface in bathtub or shower?: (!) no Good home lighting?:  yes Regular seat belt use?: yes Hospital stays in the last year:: no  Cognitive Assessment Difficulty concentrating, remembering, or making decisions? : no Will 6CIT or Mini Cog be Completed: no 6CIT or Mini Cog Declined: patient alert, oriented, able to answer questions appropriately and recall recent events  Advance Directives (For Healthcare) Does Patient Have a Medical Advance Directive?: Yes Does patient want to make changes to medical advance directive?: No - Patient declined Type of Advance Directive: Living will; Healthcare Power of Attorney Copy of Healthcare Power of Attorney in Chart?: No - copy requested Copy of Living Will in Chart?: No - copy requested  Reviewed/Updated  Reviewed/Updated: Reviewed All (Medical, Surgical, Family, Medications, Allergies, Care Teams, Patient Goals)    Allergies (verified) Patient has no known allergies.   Current Medications (verified) Outpatient Encounter Medications as of 05/28/2024  Medication Sig   ALPRAZolam  (XANAX ) 0.5 MG tablet Take 1 tablet (0.5 mg total) by mouth 2 (two) times daily as needed for anxiety.   cycloSPORINE (RESTASIS) 0.05 % ophthalmic emulsion 1 drop 2 (two) times daily.   doxycycline  (VIBRAMYCIN ) 100 MG capsule Take 1 capsule (100 mg total) by mouth 2 (two) times daily.   sertraline  (ZOLOFT ) 50 MG tablet Take 1 tablet (50 mg total) by mouth daily.   tobramycin  (TOBREX ) 0.3 % ophthalmic solution Place 1 drop into the left eye every 4 (four) hours.   [DISCONTINUED] clonazePAM  (KLONOPIN ) 0.5 MG tablet TAKE 1 TABLET BY MOUTH TWICE A DAY AS NEEDED FOR ANXIETY (Patient taking differently: daily as needed. TAKE 1 TABLET BY MOUTH TWICE A DAY AS NEEDED FOR ANXIETY)   No facility-administered encounter medications on file as of 05/28/2024.    History: Past Medical History:  Diagnosis  Date   Restless leg syndrome    Past Surgical History:  Procedure Laterality Date   NO PAST SURGERIES     Family History   Problem Relation Age of Onset   Other Mother        Cerebral hemmorage   Heart disease Father    Healthy Sister    Healthy Brother    Healthy Brother    Healthy Brother    Breast cancer Neg Hx    Social History   Occupational History   Not on file  Tobacco Use   Smoking status: Former    Types: Cigarettes    Passive exposure: Past   Smokeless tobacco: Former    Quit date: 06/02/1994  Vaping Use   Vaping status: Never Used  Substance and Sexual Activity   Alcohol use: Yes    Comment: soc   Drug use: No   Sexual activity: Not on file   Tobacco Counseling Counseling given: Not Answered  SDOH Screenings   Food Insecurity: No Food Insecurity (05/28/2024)  Housing: Low Risk (05/28/2024)  Transportation Needs: No Transportation Needs (05/28/2024)  Utilities: Not At Risk (05/28/2024)  Alcohol Screen: Low Risk (05/28/2024)  Depression (PHQ2-9): High Risk (05/28/2024)  Financial Resource Strain: Low Risk (05/28/2024)  Physical Activity: Patient Declined (05/28/2024)  Social Connections: Unknown (05/28/2024)  Stress: Stress Concern Present (05/28/2024)  Tobacco Use: Medium Risk (05/28/2024)  Health Literacy: Adequate Health Literacy (05/28/2024)   See flowsheets for full screening details  Depression Screen PHQ 2 & 9 Depression Scale- Over the past 2 weeks, how often have you been bothered by any of the following problems? Little interest or pleasure in doing things: 2 Feeling down, depressed, or hopeless (PHQ Adolescent also includes...irritable): 3 PHQ-2 Total Score: 5 Trouble falling or staying asleep, or sleeping too much: 3 Feeling tired or having little energy: 2 Poor appetite or overeating (PHQ Adolescent also includes...weight loss): 1 Feeling bad about yourself - or that you are a failure or have let yourself or your family down: 3 Trouble concentrating on things, such as reading the newspaper or watching television (PHQ Adolescent also  includes...like school work): 1 Moving or speaking so slowly that other people could have noticed. Or the opposite - being so fidgety or restless that you have been moving around a lot more than usual: 1 Thoughts that you would be better off dead, or of hurting yourself in some way: 1 PHQ-9 Total Score: 17 If you checked off any problems, how difficult have these problems made it for you to do your work, take care of things at home, or get along with other people?: Somewhat difficult      Goals Addressed   None          Objective:    Today's Vitals   05/28/24 1004  BP: (!) 140/90  Pulse: 90  SpO2: 98%  Weight: 135 lb (61.2 kg)  Height: 5' 1 (1.549 m)  PainSc: 0-No pain   Body mass index is 25.51 kg/m.   Physical Exam{(optional), or other factors deemed appropriate based on the beneficiary's medical and social history and current clinical standards. - TEXT BETWEEN BRACKETS WILL DISAPPEAR WHEN YOU SIGN THE ENCOUNTER:1}    Hearing/Vision screen No results found. Immunizations and Health Maintenance Health Maintenance  Topic Date Due   DTaP/Tdap/Td (2 - Td or Tdap) 02/17/2023   Influenza Vaccine  01/10/2024   COVID-19 Vaccine (7 - 2025-26 season) 02/10/2024   Medicare Annual Wellness (AWV)  05/28/2025  Mammogram  03/04/2026   Colonoscopy  03/25/2031   Pneumococcal Vaccine: 50+ Years  Completed   Bone Density Scan  Completed   Zoster Vaccines- Shingrix  Completed   Meningococcal B Vaccine  Aged Out   Hepatitis C Screening  Discontinued    EKG: {ekg findings:315101}     Assessment/Plan:  This is a routine wellness examination for Natasha Hale.  Patient Care Team: Perri Ronal PARAS, MD as PCP - General (Internal Medicine)  I have personally reviewed and noted the following in the patients chart:   Medical and social history Use of alcohol, tobacco or illicit drugs  Current medications and supplements including opioid prescriptions. Functional ability and  status Nutritional status Physical activity Advanced directives List of other physicians Hospitalizations, surgeries, and ER visits in previous 12 months Vitals Screenings to include cognitive, depression, and falls Referrals and appointments  Orders Placed This Encounter  Procedures   CT ABDOMEN PELVIS W WO CONTRAST    If indicated for the ordered procedure, I authorize the administration of contrast media per Radiology protocol:   Yes    Does the patient have a contrast media/X-ray dye allergy?:   No    Preferred imaging location?:   [facility]    If indicated for the ordered procedure, I authorize the administration of oral contrast media per Radiology protocol:   Yes   CA 125   POCT URINALYSIS DIP (CLINITEK)   EKG 12-Lead   In addition, I have reviewed and discussed with patient certain preventive protocols, quality metrics, and best practice recommendations. A written personalized care plan for preventive services as well as general preventive health recommendations were provided to patient.   Natasha Hale, CMA   05/28/2024   Return in 1 year (on 05/28/2025).

## 2024-05-29 ENCOUNTER — Encounter: Payer: Self-pay | Admitting: Internal Medicine

## 2024-06-01 ENCOUNTER — Ambulatory Visit: Payer: Self-pay | Admitting: Internal Medicine

## 2024-06-01 LAB — CA 125: CA 125: 11 U/mL

## 2024-06-09 ENCOUNTER — Other Ambulatory Visit

## 2024-06-10 ENCOUNTER — Ambulatory Visit (HOSPITAL_BASED_OUTPATIENT_CLINIC_OR_DEPARTMENT_OTHER)

## 2024-06-17 ENCOUNTER — Telehealth: Payer: Self-pay | Admitting: Internal Medicine

## 2024-06-18 NOTE — Telephone Encounter (Signed)
 Done

## 2024-06-18 NOTE — Addendum Note (Signed)
 Addended by: PERRI RONAL PARAS on: 06/18/2024 04:08 PM   Modules accepted: Level of Service

## 2024-06-22 NOTE — Progress Notes (Incomplete)
" ° ° °  Patient Care Team: Perri Ronal PARAS, MD as PCP - General (Internal Medicine)  Visit Date: 06/22/2024  Subjective:    Patient ID: Natasha Hale , Female   DOB: 02-20-1956, 69 y.o.    MRN: [id number]   69 y.o. Female presents today for  Medication review. Patient has a past medical history of elevated cholesterol, anxiety.     Past Medical History:  Diagnosis Date   Restless leg syndrome      Family History  Problem Relation Age of Onset   Other Mother        Cerebral hemmorage   Heart disease Father    Healthy Sister    Healthy Brother    Healthy Brother    Healthy Brother    Breast cancer Neg Hx     Social History   Social History Narrative   Not on file      ROS      Objective:   Vitals: There were no vitals taken for this visit.   Physical Exam    Results:   Studies obtained and personally reviewed by me:  Imaging, colonoscopy, mammogram, bone density scan, echocardiogram, heart cath, stress test, CT calcium  score, etc. ***   Labs:       Component Value Date/Time   NA 138 05/22/2024 0910   K 4.4 05/22/2024 0910   CL 102 05/22/2024 0910   CO2 28 05/22/2024 0910   GLUCOSE 106 (H) 05/22/2024 0910   BUN 14 05/22/2024 0910   CREATININE 0.69 05/22/2024 0910   CALCIUM  9.3 05/22/2024 0910   PROT 6.9 05/22/2024 0910   ALBUMIN 4.7 04/12/2016 1146   AST 19 05/22/2024 0910   ALT 18 05/22/2024 0910   ALKPHOS 55 04/12/2016 1146   BILITOT 1.0 05/22/2024 0910   GFRNONAA 87 10/18/2020 0918   GFRAA 101 10/18/2020 0918     Lab Results  Component Value Date   WBC 5.9 05/22/2024   HGB 14.0 05/22/2024   HCT 41.9 05/22/2024   MCV 95.0 05/22/2024   PLT 234 05/22/2024    Lab Results  Component Value Date   CHOL 188 05/22/2024   HDL 93 05/22/2024   LDLCALC 79 05/22/2024   LDLDIRECT 111 04/12/2016   TRIG 80 05/22/2024   CHOLHDL 2.0 05/22/2024    Lab Results  Component Value Date   HGBA1C 5.5 05/22/2024     Lab Results  Component  Value Date   TSH 2.10 05/22/2024     No results found for: PSA1, PSA *** delete for female pts  ***    Assessment & Plan:   ***    I,Makayla C Reid,acting as a scribe for Ronal PARAS Perri, MD.,have documented all relevant documentation on the behalf of Ronal PARAS Perri, MD,as directed by  Ronal PARAS Perri, MD while in the presence of Ronal PARAS Perri, MD.   ***   "

## 2024-06-23 ENCOUNTER — Encounter (HOSPITAL_BASED_OUTPATIENT_CLINIC_OR_DEPARTMENT_OTHER): Payer: Self-pay

## 2024-06-23 ENCOUNTER — Ambulatory Visit (HOSPITAL_BASED_OUTPATIENT_CLINIC_OR_DEPARTMENT_OTHER): Admission: RE | Admit: 2024-06-23

## 2024-06-23 ENCOUNTER — Ambulatory Visit (HOSPITAL_BASED_OUTPATIENT_CLINIC_OR_DEPARTMENT_OTHER)
Admission: RE | Admit: 2024-06-23 | Discharge: 2024-06-23 | Disposition: A | Discharge status: Home / Self Care | Source: Ambulatory Visit | Attending: Internal Medicine | Admitting: Internal Medicine

## 2024-06-23 DIAGNOSIS — R1032 Left lower quadrant pain: Secondary | ICD-10-CM | POA: Diagnosis present

## 2024-06-23 MED ORDER — IOHEXOL 300 MG/ML  SOLN
100.0000 mL | Freq: Once | INTRAMUSCULAR | Status: AC | PRN
  Administered 2024-06-23: 100 mL via INTRAVENOUS

## 2024-06-26 ENCOUNTER — Encounter: Payer: Self-pay | Admitting: Internal Medicine

## 2024-06-26 ENCOUNTER — Telehealth: Payer: Self-pay | Admitting: Internal Medicine

## 2024-06-26 NOTE — Telephone Encounter (Signed)
 Called patient as she had a question about CT findings of a fibroid. Explained this was felt to be benign and common finding and would not cause pain. She has had dizziness with Zoloft . Has stopped taking it. Has tried it at night but she is not sure she is able to tolerate it.We can try another antidepressant or refer her for med consult for antidepressant medication. MJB, MD

## 2024-06-28 ENCOUNTER — Telehealth: Payer: Self-pay | Admitting: Internal Medicine

## 2024-06-28 ENCOUNTER — Encounter: Payer: Self-pay | Admitting: Internal Medicine

## 2024-06-28 DIAGNOSIS — N858 Other specified noninflammatory disorders of uterus: Secondary | ICD-10-CM

## 2024-06-28 NOTE — Telephone Encounter (Signed)
 Discussed with patient by phone Friday the findings on abdominal CT. Possible 2.1 cm fibroid. Radiologist suggested pelvic ultrasoundto confirm. Patients called back late Friday afternoon requesting this test. Will be ordered at [facility]. MJB, MD

## 2024-07-01 NOTE — Telephone Encounter (Signed)
 Done

## 2024-07-02 ENCOUNTER — Ambulatory Visit
Admission: RE | Admit: 2024-07-02 | Discharge: 2024-07-02 | Disposition: A | Discharge status: Home / Self Care | Source: Ambulatory Visit | Attending: Internal Medicine | Admitting: Internal Medicine

## 2024-07-02 ENCOUNTER — Encounter: Payer: Self-pay | Admitting: Internal Medicine

## 2024-07-05 ENCOUNTER — Ambulatory Visit: Payer: Self-pay | Admitting: Internal Medicine

## 2024-07-06 ENCOUNTER — Ambulatory Visit: Admitting: Internal Medicine

## 2024-07-09 ENCOUNTER — Ambulatory Visit: Admitting: Internal Medicine

## 2024-07-09 ENCOUNTER — Encounter: Payer: Self-pay | Admitting: Internal Medicine

## 2024-07-09 VITALS — BP 120/80 | HR 78 | Ht 61.0 in | Wt 135.0 lb

## 2024-07-09 DIAGNOSIS — F419 Anxiety disorder, unspecified: Secondary | ICD-10-CM

## 2024-07-09 DIAGNOSIS — K52832 Lymphocytic colitis: Secondary | ICD-10-CM

## 2024-07-09 DIAGNOSIS — F4321 Adjustment disorder with depressed mood: Secondary | ICD-10-CM

## 2024-07-09 DIAGNOSIS — R1032 Left lower quadrant pain: Secondary | ICD-10-CM | POA: Diagnosis not present

## 2024-07-09 DIAGNOSIS — F439 Reaction to severe stress, unspecified: Secondary | ICD-10-CM

## 2024-07-09 DIAGNOSIS — D252 Subserosal leiomyoma of uterus: Secondary | ICD-10-CM

## 2024-07-09 NOTE — Progress Notes (Signed)
 "   Patient Care Team: Natasha Natasha PARAS, MD as PCP - General (Internal Medicine)  Visit Date: 07/09/24  Subjective:    Patient ID: Natasha Hale , Female   DOB: January 25, 1956, 69 y.o.    MRN: [id number]   69 y.o. Female presents today to review CT results. Patient has a past medical history of elevated cholesterol, anxiety.  She was last seen on 05/28/2024 where she was experiencing lower left quadrant pain. 06/23/2024 CT abdomen and pelvis  No acute findings in the abdomen or pelvis. Specifically, no findings to explain the patient's history of left lower quadrant pain. 2.1 cm right probable fundal fibroid. Pelvic ultrasound recommended to confirm. 07/02/2024 US  ultrasound. There is a 1.9 x 2.3 x 2.5 cm right subserosal leiomyoma, which corresponds to the lesion seen on the recent CT scan abdomen and pelvis. Otherwise unremarkable exam. Please note right ovary is not visualized. On 06/09/24 she saw gastroenterologist Dr. Kristie for left lower quadrant pain. She suspected that she might have gas and advised her to try Phazyme or Gas-X. For lymphocytic colitis she was prescribed Budesonide 3 mg three times daily. She was advised to continue Zoloft .     History of anxiety treated with Xanax  0.5 mg twice daily as needed for anxiety, Zoloft  50 mg daily.  she lost her husband a few months ago and she has a disabled son. Counseling has been suggested to her.   03/24/2021 Colonoscopy The entire examined colon appeared normal. The examined portion of the ileum appeared normal. No specimens collected. Repeat in 5 years.    Past Medical History:  Diagnosis Date   Restless leg syndrome      Family History  Problem Relation Age of Onset   Other Mother        Cerebral hemmorage   Heart disease Father    Healthy Sister    Healthy Brother    Healthy Brother    Healthy Brother    Breast cancer Neg Hx    Social history: She works as an pensions consultant. Her husband recently passed away. She has an adopted son  from [place] who has cerebral palsy.      Review of Systems  All other systems reviewed and are negative.       Objective:   Vitals: BP 120/80   Pulse 78   Ht 5' 1 (1.549 m)   Wt 135 lb (61.2 kg)   SpO2 97%   BMI 25.51 kg/m    Physical Exam Vitals and nursing note reviewed.       Results:   Studies obtained and personally reviewed by me:  06/23/2024 CT abdomen and pelvis No acute findings in the abdomen or pelvis. Specifically, no findings to explain the patient's history of left lower quadrant pain. 2.1 cm right probable fundal fibroid. Pelvic ultrasound recommended to confirm.    07/02/2024 US  ultrasound. There is a 1.9 x 2.3 x 2.5 cm right subserosal leiomyoma, which corresponds to the lesion seen on the recent CT scan abdomen and pelvis. Otherwise unremarkable exam. Please note right ovary is not visualized.    03/24/2021 Colonoscopy The entire examined colon appeared normal. The examined portion of the ileum appeared normal. No specimens collected. Repeat in 5 years.    Labs:       Component Value Date/Time   NA 138 05/22/2024 0910   K 4.4 05/22/2024 0910   CL 102 05/22/2024 0910   CO2 28 05/22/2024 0910   GLUCOSE 106 (H) 05/22/2024 0910  BUN 14 05/22/2024 0910   CREATININE 0.69 05/22/2024 0910   CALCIUM  9.3 05/22/2024 0910   PROT 6.9 05/22/2024 0910   ALBUMIN 4.7 04/12/2016 1146   AST 19 05/22/2024 0910   ALT 18 05/22/2024 0910   ALKPHOS 55 04/12/2016 1146   BILITOT 1.0 05/22/2024 0910   GFRNONAA 87 10/18/2020 0918   GFRAA 101 10/18/2020 0918     Lab Results  Component Value Date   WBC 5.9 05/22/2024   HGB 14.0 05/22/2024   HCT 41.9 05/22/2024   MCV 95.0 05/22/2024   PLT 234 05/22/2024    Lab Results  Component Value Date   CHOL 188 05/22/2024   HDL 93 05/22/2024   LDLCALC 79 05/22/2024   LDLDIRECT 111 04/12/2016   TRIG 80 05/22/2024   CHOLHDL 2.0 05/22/2024    Lab Results  Component Value Date   HGBA1C 5.5 05/22/2024     Lab  Results  Component Value Date   TSH 2.10 05/22/2024        Assessment & Plan:   Left lower quadrant pain: She was last seen on 05/28/2024 where she was experiencing lower left quadrant pain. 06/23/2024 CT abdomen and pelvis  No acute findings in the abdomen or pelvis. Specifically, no findings to explain the patient's history of left lower quadrant pain. 2.1 cm right probable fundal fibroid. Pelvic ultrasound recommended to confirm. 07/02/2024 US  ultrasound. There is a 1.9 x 2.3 x 2.5 cm right subserosal leiomyoma, which corresponds to the lesion seen on the recent CT scan abdomen and pelvis. Otherwise unremarkable exam. Please note right ovary is not visualized. On 06/09/24 she saw Gastroenterologist Dr. Kristie for left lower quadrant pain. She suspected that she might have gas and advised her to try Phazyme or Gas-X. For lymphocytic collitis she was prescribed Budesonide 3 mg three times daily. She was advised to continue Zoloft .   Consider GYN consultation.  Anxiety: treated with Xanax  0.5 mg twice daily as needed for anxiety. For  Depression and grief reaction:  take Zoloft  50 mg daily.  She lost her husband a few months ago and she has a disabled son. Counseling has been suggested.  03/24/2021 Colonoscopy The entire examined colon appeared normal. The examined portion of the ileum appeared normal. No specimens collected. Repeat in 5 years.   Lymphocytic colitis seen recently by Dr Natasha Hale Natasha Hale,acting as a scribe for Natasha [name], MD.,have documented all relevant documentation on the behalf of Natasha [name], MD,as directed by  Natasha [name], MD while in the presence of Natasha [name], MD.  I, Natasha [name], MD, have reviewed all documentation for this visit. The documentation on 07/09/2024 for the exam, diagnosis, procedures, and orders are all accurate and complete.     "

## 2024-07-10 ENCOUNTER — Encounter: Payer: Self-pay | Admitting: Internal Medicine

## 2024-07-10 NOTE — Patient Instructions (Signed)
 I attempted to review CT with her today. She may benefit from GYN consultation.It would be helpful in explaining ultrasound results.  Continue Xanax  and Zoloft . Continue Bedesonide per Dr. Kristie.
